# Patient Record
Sex: Female | Born: 1935 | Race: White | Hispanic: No | Smoking: Former smoker
Health system: Southern US, Community
[De-identification: ages and names within clinical notes are randomized; demographics above are authoritative.]

## PROBLEM LIST (undated history)

## (undated) DIAGNOSIS — I10 Essential (primary) hypertension: Secondary | ICD-10-CM

## (undated) DIAGNOSIS — N289 Disorder of kidney and ureter, unspecified: Secondary | ICD-10-CM

## (undated) DIAGNOSIS — D649 Anemia, unspecified: Secondary | ICD-10-CM

## (undated) HISTORY — PX: THYROID SURGERY: SHX805

---

## 1997-08-28 ENCOUNTER — Ambulatory Visit (HOSPITAL_COMMUNITY): Admission: RE | Admit: 1997-08-28 | Discharge: 1997-08-28 | Payer: Self-pay | Admitting: *Deleted

## 1998-12-24 ENCOUNTER — Other Ambulatory Visit: Admission: RE | Admit: 1998-12-24 | Discharge: 1998-12-24 | Payer: Self-pay | Admitting: Gastroenterology

## 1998-12-24 ENCOUNTER — Encounter (INDEPENDENT_AMBULATORY_CARE_PROVIDER_SITE_OTHER): Payer: Self-pay | Admitting: *Deleted

## 2000-03-20 ENCOUNTER — Other Ambulatory Visit: Admission: RE | Admit: 2000-03-20 | Discharge: 2000-03-20 | Payer: Self-pay | Admitting: Obstetrics and Gynecology

## 2001-03-15 ENCOUNTER — Other Ambulatory Visit: Admission: RE | Admit: 2001-03-15 | Discharge: 2001-03-15 | Payer: Self-pay | Admitting: Obstetrics and Gynecology

## 2002-01-25 ENCOUNTER — Encounter (INDEPENDENT_AMBULATORY_CARE_PROVIDER_SITE_OTHER): Payer: Self-pay | Admitting: *Deleted

## 2002-12-24 ENCOUNTER — Encounter: Admission: RE | Admit: 2002-12-24 | Discharge: 2002-12-24 | Payer: Self-pay | Admitting: Endocrinology

## 2003-12-24 ENCOUNTER — Ambulatory Visit: Payer: Self-pay | Admitting: Gastroenterology

## 2003-12-30 ENCOUNTER — Ambulatory Visit (HOSPITAL_COMMUNITY): Admission: RE | Admit: 2003-12-30 | Discharge: 2003-12-30 | Payer: Self-pay | Admitting: Gastroenterology

## 2004-01-16 ENCOUNTER — Ambulatory Visit: Payer: Self-pay | Admitting: Gastroenterology

## 2004-01-19 ENCOUNTER — Encounter (INDEPENDENT_AMBULATORY_CARE_PROVIDER_SITE_OTHER): Payer: Self-pay | Admitting: *Deleted

## 2004-01-19 ENCOUNTER — Ambulatory Visit: Payer: Self-pay | Admitting: Gastroenterology

## 2004-01-30 ENCOUNTER — Ambulatory Visit (HOSPITAL_COMMUNITY): Admission: RE | Admit: 2004-01-30 | Discharge: 2004-01-30 | Payer: Self-pay | Admitting: Gastroenterology

## 2004-05-14 ENCOUNTER — Ambulatory Visit: Payer: Self-pay | Admitting: Gastroenterology

## 2004-12-20 ENCOUNTER — Ambulatory Visit: Payer: Self-pay | Admitting: Gastroenterology

## 2004-12-23 ENCOUNTER — Encounter (INDEPENDENT_AMBULATORY_CARE_PROVIDER_SITE_OTHER): Payer: Self-pay | Admitting: Specialist

## 2004-12-23 ENCOUNTER — Encounter (INDEPENDENT_AMBULATORY_CARE_PROVIDER_SITE_OTHER): Payer: Self-pay | Admitting: *Deleted

## 2004-12-23 ENCOUNTER — Ambulatory Visit: Payer: Self-pay | Admitting: Gastroenterology

## 2005-05-05 ENCOUNTER — Encounter: Admission: RE | Admit: 2005-05-05 | Discharge: 2005-05-05 | Payer: Self-pay | Admitting: Endocrinology

## 2006-01-27 ENCOUNTER — Encounter: Admission: RE | Admit: 2006-01-27 | Discharge: 2006-01-27 | Payer: Self-pay | Admitting: Endocrinology

## 2006-03-17 ENCOUNTER — Encounter: Admission: RE | Admit: 2006-03-17 | Discharge: 2006-03-17 | Payer: Self-pay | Admitting: Obstetrics and Gynecology

## 2007-05-25 ENCOUNTER — Ambulatory Visit (HOSPITAL_COMMUNITY): Admission: RE | Admit: 2007-05-25 | Discharge: 2007-05-25 | Payer: Self-pay | Admitting: Obstetrics and Gynecology

## 2007-08-31 ENCOUNTER — Encounter: Admission: RE | Admit: 2007-08-31 | Discharge: 2007-08-31 | Payer: Self-pay | Admitting: Endocrinology

## 2007-08-31 ENCOUNTER — Encounter: Payer: Self-pay | Admitting: Gastroenterology

## 2008-11-07 ENCOUNTER — Encounter (INDEPENDENT_AMBULATORY_CARE_PROVIDER_SITE_OTHER): Payer: Self-pay | Admitting: *Deleted

## 2008-11-21 ENCOUNTER — Ambulatory Visit: Payer: Self-pay | Admitting: Gastroenterology

## 2008-11-21 ENCOUNTER — Encounter (INDEPENDENT_AMBULATORY_CARE_PROVIDER_SITE_OTHER): Payer: Self-pay | Admitting: *Deleted

## 2009-01-09 ENCOUNTER — Ambulatory Visit: Payer: Self-pay | Admitting: Gastroenterology

## 2009-01-09 DIAGNOSIS — Z860100 Personal history of colon polyps, unspecified: Secondary | ICD-10-CM | POA: Insufficient documentation

## 2009-01-09 DIAGNOSIS — I10 Essential (primary) hypertension: Secondary | ICD-10-CM

## 2009-01-12 ENCOUNTER — Ambulatory Visit: Payer: Self-pay | Admitting: Gastroenterology

## 2009-09-01 ENCOUNTER — Encounter: Admission: RE | Admit: 2009-09-01 | Discharge: 2009-09-01 | Payer: Self-pay | Admitting: Endocrinology

## 2010-01-23 ENCOUNTER — Encounter: Payer: Self-pay | Admitting: Gastroenterology

## 2010-01-24 ENCOUNTER — Encounter: Payer: Self-pay | Admitting: Obstetrics and Gynecology

## 2010-02-04 NOTE — Procedures (Signed)
Summary: EGD Lyla Son)   EGD  Procedure date:  01/19/2004  Findings:      Location: [facility]   Patient Name: Sheila Herring, Sheila Herring MRN:  Procedure Procedures: Panendoscopy (EGD) CPT: [id number].  Personnel: Endoscopist: Clarene Reamer, MD.  Exam Location: Exam performed in Outpatient Clinic. Outpatient  Patient Consent: Procedure, Alternatives, Risks and Benefits discussed, consent obtained, from patient. Consent was obtained by the RN.  Indications Symptoms: Dysphagia.  History  Current Medications: Patient is not currently taking Coumadin.  Pre-Exam Physical: Performed Jan 25, 2002  Entire physical exam was normal. Cardio- pulmonary exam, HEENT exam, Abdominal exam, Extremity exam, Mental status exam WNL.  Exam Exam Info: Maximum depth of insertion Duodenum, intended Duodenum. Vocal cords visualized. Gastric retroflexion performed. Images were not taken. ASA Classification: II. Tolerance: good.  Sedation Meds: Patient assessed and found to be appropriate for moderate (conscious) sedation. Fentanyl 50 mcg. given IV. Versed 4 mg. given IV. Cetacaine Spray 2 sprays given aerosolized.  Monitoring: BP and pulse monitoring done. Oximetry used. Supplemental O2 given  Findings - HIATAL HERNIA: 1 cms. in length. decreased les with decreased cricopharyngeus.  - Dilation: Fundus. Maloney dilator used, Diameter: 54 mm, No Resistance, No Heme present on extraction. Patient tolerance good.  - Normal: Fundus to Jejunum. Comments: no significat motility noted.   Assessment Abnormal examination, see findings above.  Events  Unplanned Intervention: No unplanned interventions were required.  Unplanned Events: There were no complications. Plans Medication(s): Continue current medications. Promotility: Metaclopramide 5mg  ac ac,   Patient Education: Patient given standard instructions for: Hiatal Hernia. believe primarily a motility disorder.    Disposition: After procedure patient sent to recovery. After recovery patient sent home.   This report was created from the original endoscopy report, which was reviewed and signed by the above listed endoscopist.    cc. Aldean Jewett

## 2010-02-04 NOTE — Procedures (Signed)
Summary: Colonoscopy  Lyla Son)   Colonoscopy  Procedure date:  12/23/2004  Findings:      Results: Polyp.  Location:  [facility].    Colonoscopy  Procedure date:  12/23/2004  Findings:      Results: Polyp.  Location:  [facility].   Patient Name: Sheila Herring, Sheila Herring MRN:  Procedure Procedures: Colonoscopy CPT: [phone].  Personnel: Endoscopist: Clarene Reamer, MD.  Exam Location: Exam performed in Outpatient Clinic. Outpatient  Patient Consent: Procedure, Alternatives, Risks and Benefits discussed, consent obtained, from patient. Consent was obtained by the RN.  Indications  Evaluation of: Polyps seen on prior Colonoscopy.  Surveillance of: Adenomatous Polyp(s).  History  Current Medications: Patient is not currently taking Coumadin.  Pre-Exam Physical: Performed Jan 25, 2002. Entire physical exam was normal. Cardio- pulmonary exam, Rectal exam, HEENT exam , Abdominal exam, Extremity exam, Mental status exam WNL.  Comments: Pt. history reviewed/updated, physical exam performed prior to initiation of sedation? Exam Exam: Extent of exam reached: Cecum, extent intended: Cecum.  The cecum was identified by appendiceal orifice and IC valve. Colon retroflexion performed. Images taken. ASA Classification: II. Tolerance: good.  Monitoring: Pulse and BP monitoring, Oximetry used. Supplemental O2 given.  Colon Prep Prep results: good.  Sedation Meds: Patient assessed and found to be appropriate for moderate (conscious) sedation. Fentanyl given IV. Versed given IV.  Findings POLYP: Sigmoid Colon, Maximum size: 2 mm. sessile polyp. Procedure:  hot biopsy, removed, not retrieved,  POLYP: Sigmoid Colon, Maximum size: 6 mm. sessile polyp. Procedure:  hot biopsy, removed, retrieved, Polyp sent to pathology. ICD9: Colon Polyps: 211.3.  POLYP: Sigmoid Colon, Maximum size: 2 mm. sessile polyp. Procedure:  hot biopsy, removed, not  retrieved,  POLYP: Sigmoid Colon, Maximum size: 2 mm. sessile polyp. Procedure:  hot biopsy, removed, not retrieved,  POLYP: Rectum, Maximum size: 2 mm. sessile polyp. Procedure:  hot biopsy, removed, not retrieved,   Assessment Abnormal examination, see findings above.  Diagnoses: 211.3: Colon Polyps.   Events  Unplanned Interventions: No intervention was required.  Unplanned Events: There were no complications. Plans Disposition: After procedure patient sent to recovery. After recovery patient sent home.  This report was created from the original endoscopy report, which was reviewed and signed by the above listed endoscopist.    cc. Aldean Jewett

## 2010-02-04 NOTE — Procedures (Signed)
Summary: Colonoscopy  Patient: [name] Note: All result statuses are Final unless otherwise noted.  Tests: (1) Colonoscopy (COL)   COL Colonoscopy           [facility]     [place], [place]  [address]           COLONOSCOPY PROCEDURE REPORT           PATIENT:  Sheila Herring, Sheila Herring  MR#:  [id number]     BIRTHDATE:  [birth year], 75 yrs. old  GENDER:  female           ENDOSCOPIST:  Sandy Salaam. Deatra Ina, MD     Referred by:           PROCEDURE DATE:  01/12/2009     PROCEDURE:  Surveillance Colonoscopy     ASA CLASS:  Class II     INDICATIONS:  history of pre-cancerous (adenomatous) colon polyps                 MEDICATIONS:   Fentanyl 75 mcg IV, Versed 9 mg IV           DESCRIPTION OF PROCEDURE:   After the risks benefits and     alternatives of the procedure were thoroughly explained, informed     consent was obtained.  Digital rectal exam was performed and     revealed no abnormalities.   The LB [id number] endoscope     was introduced through the anus and advanced to the cecum, which     was identified by the ileocecal valve, Moderate amount of retained     solid and liquid stool  The quality of the prep was adequate.  The     instrument was then slowly withdrawn as the colon was fully     examined.     <<PROCEDUREIMAGES>>           FINDINGS:  Mild diverticulosis was found in the sigmoid colon (see     image13).  This was otherwise a normal examination of the colon     (see image2, image4, image5, image7, image9, image10, image12,     image14, and image15).   Retroflexed views in the rectum revealed     no abnormalities.    The scope was then withdrawn from the patient     and the procedure completed.           COMPLICATIONS:  None           ENDOSCOPIC IMPRESSION:     1) Mild diverticulosis in the sigmoid colon     2) Otherwise normal examination     RECOMMENDATIONS:     1) colonoscopy           REPEAT EXAM:  In 7 year(s) for  Colonoscopy.           ______________________________     Sandy Salaam. Deatra Ina, MD           CC:  Margaretmary Dys, MD           n.     Lorrin Mais:   Sandy Salaam. Mykela Mewborn at 01/12/2009 03:19 PM           Deetta Perla, [id number]  Note: An exclamation mark (!) indicates a result that was not dispersed into the flowsheet. Document Creation Date: 01/12/2009 3:19 PM _______________________________________________________________________  (1) Order result status: Final Collection or observation date-time: 01/12/2009 15:14 Requested date-time:  Receipt date-time:  Reported date-time:  Referring Physician:   Ordering Physician: Erskine Emery [phone]) Specimen Source:  Source: Tawanna Cooler Order Number: [id number] Lab site:   Appended Document: Colonoscopy    Clinical Lists Changes  Observations: Added new observation of COLONNXTDUE: 01/2016 (01/12/2009 15:40)

## 2010-02-04 NOTE — Procedures (Signed)
Summary: Colonoscopy Lyla Son)   Colonoscopy  Procedure date:  01/25/2002  Findings:      Results: Polyp.  Location:  [facility].    Comments:      Repeat colonoscopy in 2 years.    Colonoscopy  Procedure date:  01/25/2002  Findings:      Results: Polyp.  Location:  [facility].    Comments:      Repeat colonoscopy in 2 years.   Patient Name: Sheila Herring, Trimarchi MRN:  Procedure Procedures: Colonoscopy CPT: [phone].    with Hot Biopsy(s)CPT: [id number].    with polypectomy. CPT: [id number].  Personnel: Endoscopist: Clarene Reamer, MD.  Exam Location: Exam performed in Outpatient Clinic. Outpatient  Patient Consent: Procedure, Alternatives, Risks and Benefits discussed, consent obtained, from patient. Consent was obtained by the RN.  Indications  Surveillance of: Adenomatous Polyp(s).  History  Pre-Exam Physical: Performed Jan 25, 2002. Cardio-pulmonary exam, Rectal exam, HEENT exam , Abdominal exam, Extremity exam, Mental status exam WNL.  Exam Exam: Extent of exam reached: Cecum, extent intended: Cecum.  The cecum was identified by appendiceal orifice and IC valve. Colon retroflexion performed. Images were not taken. ASA Classification: II. Tolerance: good.  Monitoring: Pulse and BP monitoring, Oximetry used. Supplemental O2 given.  Colon Prep Prep results: good.  Sedation Meds: Patient assessed and found to be appropriate for moderate (conscious) sedation. Fentanyl 100 mcg. given IV. Versed 5 mg. given IV.  Findings - MULTIPLE POLYPS: Splenic Flexure to Sigmoid Colon. minimum size 2 mm, maximum size 4 mm. Procedure:  hot biopsy, removed, Polyp not retrieved, ICD9: Colon Polyps: 211.3.   Assessment Abnormal examination, see findings above.  Diagnoses: 211.3: Colon Polyps.   Events  Unplanned Interventions: No intervention was required.  Unplanned Events: There were no complications. Plans Medication Plan: Continue  current medications.  Patient Education: Patient given standard instructions for: Polyps. Yearly hemoccult testing recommended. Patient instructed to get routine colonoscopy every 2 years.  Disposition: After procedure patient sent to recovery. After recovery patient sent home.  This report was created from the original endoscopy report, which was reviewed and signed by the above listed endoscopist.

## 2010-02-04 NOTE — Letter (Signed)
Summary: Colonoscopy/with Mag citrate/fleet enema  [facility]  [place], [place] [address]   Phone: [phone]  Fax: [phone]       [name]    01-Mar-1933    MRN: [id number]        Procedure Day /Date:MONDAY 01/12/2009     Arrival Time:1:30PM     Procedure Time:2:30PM     Location of Procedure:                    X  [place] (4th Floor)   PREPARATION FOR COLONOSCOPY WITH MAGNESIUM CITRATE/FLEET ENEMA  3 days before your procedure, purchase three  8 oz. bottle of Magnesium Citrate and two Fleet Enema from the laxative section of your drugstore.  _________________________________________________________________________________________________  3 DAYS  BEFORE YOUR PROCEDURE             DATE: 01/10/2009 DAY: SATURDAY  1.   You will be on a clear liquid diet  2.   Do not drink anything colored red or purple.  Avoid juices with pulp.  No orange juice.              CLEAR LIQUIDS INCLUDE: Water Jello Ice Popsicles Tea (sugar ok, no milk/cream) Powdered fruit flavored drinks Coffee (sugar ok, no milk/cream) Gatorade Juice: apple, white grape, white cranberry  Lemonade Clear bullion, consomm, broth Carbonated beverages (any kind) Strained chicken noodle soup Hard Candy   3. You will drink one bottle of Mag citrate in the morning and 1 bottle in the afternoon  Sunday 1/9 (2) DAYS before- Stay on clear liquids, you will drink one bottle of Mag Citraite in the morning and use 1 fleet enema in the afternoon ___________________________________________________________________________________________________  THE DAY OF YOUR PROCEDURE     Day-3       DATE: 01/12/2009 DAY: monday  1.  Use Fleet Enema one hour prior to coming for procedure.  2.   You may drink clear liquids until 12:30pm (2 hours before exam)       MEDICATION INSTRUCTIONS  Unless otherwise instructed, you should take regular prescription medications  with a small sip of water as early as possible the morning of your procedure.           OTHER INSTRUCTIONS  You will need a responsible adult at least 75 years of age to accompany you and drive you home.   This person must remain in the waiting room during your procedure.  Wear loose fitting clothing that is easily removed.  Leave jewelry and other valuables at home.  However, you may wish to bring a book to read or an iPod/MP3 player to listen to music as you wait for your procedure to start.  Remove all body piercing jewelry and leave at home.  Total time from sign-in until discharge is approximately 2-3 hours.  You should go home directly after your procedure and rest.  You can resume normal activities the day after your procedure.  The day of your procedure you should not:   Drive   Make legal decisions   Operate machinery   Drink alcohol   Return to work  You will receive specific instructions about eating, activities and medications before you leave.   The above instructions have been reviewed and explained to me by   _______________________    I fully understand and can verbalize these instructions _____________________________ Date _________

## 2010-02-04 NOTE — Assessment & Plan Note (Signed)
Summary: DISCUSS REC COL/SCRNING...AS.   History of Present Illness Visit Type: new patient  Primary GI MD: Erskine Emery MD [facility] Primary Provider: Margaretmary Dys, MD  Requesting Provider: n/a Chief Complaint: dysphagia History of Present Illness:   Sheila Herring is a 77 year old white female referred for followup of colon polyps.  A sessile villous adenoma of the rectum was removed transanally in 1997.  She has had followup colonoscopies with removal of polyps.  Last colonoscopy in 2006 demonstrated hyperplastic polyps only.  She has no GI complaints including abdominal pain, change in bowel habits, melena or hematochezia.   GI Review of Systems    Reports dysphagia with liquids.      Denies abdominal pain, acid reflux, belching, bloating, chest pain, dysphagia with solids, heartburn, loss of appetite, nausea, vomiting, vomiting blood, weight loss, and  weight gain.        Denies anal fissure, black tarry stools, change in bowel habit, constipation, diarrhea, diverticulosis, fecal incontinence, heme positive stool, hemorrhoids, irritable bowel syndrome, jaundice, light color stool, liver problems, rectal bleeding, and  rectal pain.                                                         [id number]   Current Medications (verified): 1)  Atenolol 25 Mg Tabs (Atenolol) .Marland Kitchen.. 1 By Mouth Once Daily 2)  Lipitor 10 Mg Tabs (Atorvastatin Calcium) .Marland Kitchen.. 1 By Mouth Once Daily 3)  Hydrochlorothiazide 25 Mg Tabs (Hydrochlorothiazide) .... 1/2 By Mouth Once Daily 4)  Synthroid 125 Mcg Tabs (Levothyroxine Sodium) .... 1/2 Tablet By Mouth Once Daily 5)  Cyanocobalamin 1000 Mcg/ml Soln (Cyanocobalamin) .Marland Kitchen.. 1 Injection Monthly 6)  Zantac 75 75 Mg Tabs (Ranitidine Hcl) .... One Tablet By Mouth Once Daily  Allergies (verified): 1)  !  Sulfa  Past History:  Past Medical History: hyperplastic polyp 2006 rectal polyp 1997 tubular adenomatous polyps 1997 GERD Hypertension Hypothyroidism Hyperlipidemia Anxiety Disorder Anal Fissure Anemia  Past Surgical History: Thyroid Surgery  Family History: No FH of Colon Cancer: Family History of Diabetes: mother and brother  Family History of Heart Disease: maternal side heart problems   Social History: Occupation: Retired Widowed 4 Childern Patient is a former smoker.  Alcohol Use - yes Daily Caffeine Use: one daily  Illicit Drug Use - no Smoking Status:  quit Drug Use:  no  Review of Systems       The patient complains of fever, itching, and sleeping problems.  The patient denies allergy/sinus, anemia, anxiety-new, arthritis/joint pain, back pain, blood in urine, breast changes/lumps, change in vision, confusion, cough, coughing up blood, depression-new, fainting, fatigue, headaches-new, hearing problems, heart murmur, heart rhythm changes, menstrual pain, muscle pains/cramps, night sweats, nosebleeds, pregnancy symptoms, shortness of breath, skin rash, sore throat, swelling of feet/legs, swollen lymph glands, thirst - excessive , urination - excessive , urination changes/pain, urine leakage, vision changes, and voice change.         All other systems were reviewed and were negative   Vital Signs:  Patient profile:   77 year old female Height:      64 inches Weight:      138 pounds BMI:     23.77 BSA:     1.67 Pulse rate:   60 / minute Pulse rhythm:   regular BP sitting:   110 /  64  (left arm) Cuff size:   regular  Vitals Entered By: Hope Pigeon CMA (January 09, 2009 10:32 AM)  Physical Exam  Additional Exam:  She is a well-developed well-nourished female  skin: anicteric HEENT: normocephalic; PEERLA; no nasal or pharyngeal abnormalities neck: supple nodes: no cervical lymphadenopathy chest: clear to ausculatation and percussion heart: no  murmurs, gallops, or rubs abd: soft, nontender; BS normoactive; no abdominal masses, tenderness, organomegaly rectal: deferred ext: no cynanosis, clubbing, edema skeletal: no deformities neuro: oriented x 3; no focal abnormalities    Impression & Recommendations:  Problem # 1:  PERSONAL HISTORY OF COLONIC POLYPS (ICD-V12.72)  Plan followup colonoscopy  Orders: Colonoscopy (Colon)  Problem # 2:  ESSENTIAL HYPERTENSION, BENIGN (ICD-401.1) Assessment: Comment Only  Patient Instructions: 1)  cc Dr. Margaretmary Dys 2)  Your Colonoscopy is scheduled for 01/12/2009 at 2:30pm 3)  The medication list was reviewed and reconciled.  All changed / newly prescribed medications were explained.  A complete medication list was provided to the patient / caregiver.

## 2011-01-07 DIAGNOSIS — I10 Essential (primary) hypertension: Secondary | ICD-10-CM | POA: Diagnosis not present

## 2011-01-07 DIAGNOSIS — E039 Hypothyroidism, unspecified: Secondary | ICD-10-CM | POA: Diagnosis not present

## 2011-01-07 DIAGNOSIS — G479 Sleep disorder, unspecified: Secondary | ICD-10-CM | POA: Diagnosis not present

## 2011-04-08 DIAGNOSIS — E785 Hyperlipidemia, unspecified: Secondary | ICD-10-CM | POA: Diagnosis not present

## 2011-04-08 DIAGNOSIS — I10 Essential (primary) hypertension: Secondary | ICD-10-CM | POA: Diagnosis not present

## 2011-04-08 DIAGNOSIS — E039 Hypothyroidism, unspecified: Secondary | ICD-10-CM | POA: Diagnosis not present

## 2011-04-08 DIAGNOSIS — G479 Sleep disorder, unspecified: Secondary | ICD-10-CM | POA: Diagnosis not present

## 2011-08-12 ENCOUNTER — Other Ambulatory Visit: Payer: Self-pay | Admitting: Family Medicine

## 2011-08-12 DIAGNOSIS — D510 Vitamin B12 deficiency anemia due to intrinsic factor deficiency: Secondary | ICD-10-CM | POA: Diagnosis not present

## 2011-08-12 DIAGNOSIS — I714 Abdominal aortic aneurysm, without rupture: Secondary | ICD-10-CM

## 2011-08-12 DIAGNOSIS — I10 Essential (primary) hypertension: Secondary | ICD-10-CM | POA: Diagnosis not present

## 2011-08-12 DIAGNOSIS — E039 Hypothyroidism, unspecified: Secondary | ICD-10-CM | POA: Diagnosis not present

## 2011-08-19 ENCOUNTER — Ambulatory Visit
Admission: RE | Admit: 2011-08-19 | Discharge: 2011-08-19 | Disposition: A | Discharge status: Home / Self Care | Payer: Medicare Other | Source: Ambulatory Visit | Attending: Family Medicine | Admitting: Family Medicine

## 2011-08-19 DIAGNOSIS — I10 Essential (primary) hypertension: Secondary | ICD-10-CM | POA: Diagnosis not present

## 2011-08-19 DIAGNOSIS — I714 Abdominal aortic aneurysm, without rupture: Secondary | ICD-10-CM

## 2011-10-08 DIAGNOSIS — Z23 Encounter for immunization: Secondary | ICD-10-CM | POA: Diagnosis not present

## 2012-01-24 DIAGNOSIS — Z01419 Encounter for gynecological examination (general) (routine) without abnormal findings: Secondary | ICD-10-CM | POA: Diagnosis not present

## 2012-01-24 DIAGNOSIS — Z1231 Encounter for screening mammogram for malignant neoplasm of breast: Secondary | ICD-10-CM | POA: Diagnosis not present

## 2012-02-24 DIAGNOSIS — N184 Chronic kidney disease, stage 4 (severe): Secondary | ICD-10-CM | POA: Diagnosis not present

## 2012-02-24 DIAGNOSIS — E039 Hypothyroidism, unspecified: Secondary | ICD-10-CM | POA: Diagnosis not present

## 2012-02-24 DIAGNOSIS — E785 Hyperlipidemia, unspecified: Secondary | ICD-10-CM | POA: Diagnosis not present

## 2012-02-24 DIAGNOSIS — D510 Vitamin B12 deficiency anemia due to intrinsic factor deficiency: Secondary | ICD-10-CM | POA: Diagnosis not present

## 2012-04-12 DIAGNOSIS — D4959 Neoplasm of unspecified behavior of other genitourinary organ: Secondary | ICD-10-CM | POA: Diagnosis not present

## 2012-04-12 DIAGNOSIS — N760 Acute vaginitis: Secondary | ICD-10-CM | POA: Diagnosis not present

## 2012-04-12 DIAGNOSIS — A6004 Herpesviral vulvovaginitis: Secondary | ICD-10-CM | POA: Diagnosis not present

## 2012-04-17 DIAGNOSIS — A6004 Herpesviral vulvovaginitis: Secondary | ICD-10-CM | POA: Diagnosis not present

## 2012-05-11 DIAGNOSIS — D510 Vitamin B12 deficiency anemia due to intrinsic factor deficiency: Secondary | ICD-10-CM | POA: Diagnosis not present

## 2012-06-01 DIAGNOSIS — I10 Essential (primary) hypertension: Secondary | ICD-10-CM | POA: Diagnosis not present

## 2012-06-01 DIAGNOSIS — D649 Anemia, unspecified: Secondary | ICD-10-CM | POA: Diagnosis not present

## 2012-06-01 DIAGNOSIS — N184 Chronic kidney disease, stage 4 (severe): Secondary | ICD-10-CM | POA: Diagnosis not present

## 2012-06-01 DIAGNOSIS — I129 Hypertensive chronic kidney disease with stage 1 through stage 4 chronic kidney disease, or unspecified chronic kidney disease: Secondary | ICD-10-CM | POA: Diagnosis not present

## 2012-06-01 DIAGNOSIS — N2581 Secondary hyperparathyroidism of renal origin: Secondary | ICD-10-CM | POA: Diagnosis not present

## 2012-06-08 DIAGNOSIS — D510 Vitamin B12 deficiency anemia due to intrinsic factor deficiency: Secondary | ICD-10-CM | POA: Diagnosis not present

## 2012-06-21 DIAGNOSIS — N184 Chronic kidney disease, stage 4 (severe): Secondary | ICD-10-CM | POA: Diagnosis not present

## 2012-06-21 DIAGNOSIS — I120 Hypertensive chronic kidney disease with stage 5 chronic kidney disease or end stage renal disease: Secondary | ICD-10-CM | POA: Diagnosis not present

## 2012-07-09 DIAGNOSIS — E785 Hyperlipidemia, unspecified: Secondary | ICD-10-CM | POA: Diagnosis not present

## 2012-07-09 DIAGNOSIS — E213 Hyperparathyroidism, unspecified: Secondary | ICD-10-CM | POA: Diagnosis not present

## 2012-07-09 DIAGNOSIS — I129 Hypertensive chronic kidney disease with stage 1 through stage 4 chronic kidney disease, or unspecified chronic kidney disease: Secondary | ICD-10-CM | POA: Diagnosis not present

## 2012-07-09 DIAGNOSIS — E039 Hypothyroidism, unspecified: Secondary | ICD-10-CM | POA: Diagnosis not present

## 2012-07-09 DIAGNOSIS — I10 Essential (primary) hypertension: Secondary | ICD-10-CM | POA: Diagnosis not present

## 2012-07-09 DIAGNOSIS — N2581 Secondary hyperparathyroidism of renal origin: Secondary | ICD-10-CM | POA: Diagnosis not present

## 2012-09-07 DIAGNOSIS — I10 Essential (primary) hypertension: Secondary | ICD-10-CM | POA: Diagnosis not present

## 2012-09-07 DIAGNOSIS — D510 Vitamin B12 deficiency anemia due to intrinsic factor deficiency: Secondary | ICD-10-CM | POA: Diagnosis not present

## 2012-09-07 DIAGNOSIS — E039 Hypothyroidism, unspecified: Secondary | ICD-10-CM | POA: Diagnosis not present

## 2012-09-07 DIAGNOSIS — E785 Hyperlipidemia, unspecified: Secondary | ICD-10-CM | POA: Diagnosis not present

## 2012-10-13 DIAGNOSIS — Z23 Encounter for immunization: Secondary | ICD-10-CM | POA: Diagnosis not present

## 2012-10-25 DIAGNOSIS — N189 Chronic kidney disease, unspecified: Secondary | ICD-10-CM | POA: Diagnosis not present

## 2012-10-25 DIAGNOSIS — D649 Anemia, unspecified: Secondary | ICD-10-CM | POA: Diagnosis not present

## 2012-10-25 DIAGNOSIS — N2581 Secondary hyperparathyroidism of renal origin: Secondary | ICD-10-CM | POA: Diagnosis not present

## 2012-10-25 DIAGNOSIS — I10 Essential (primary) hypertension: Secondary | ICD-10-CM | POA: Diagnosis not present

## 2012-11-05 DIAGNOSIS — I120 Hypertensive chronic kidney disease with stage 5 chronic kidney disease or end stage renal disease: Secondary | ICD-10-CM | POA: Diagnosis not present

## 2012-11-19 DIAGNOSIS — D510 Vitamin B12 deficiency anemia due to intrinsic factor deficiency: Secondary | ICD-10-CM | POA: Diagnosis not present

## 2012-11-19 DIAGNOSIS — M79609 Pain in unspecified limb: Secondary | ICD-10-CM | POA: Diagnosis not present

## 2013-04-15 DIAGNOSIS — N184 Chronic kidney disease, stage 4 (severe): Secondary | ICD-10-CM | POA: Diagnosis not present

## 2013-04-15 DIAGNOSIS — E039 Hypothyroidism, unspecified: Secondary | ICD-10-CM | POA: Diagnosis not present

## 2013-04-15 DIAGNOSIS — I10 Essential (primary) hypertension: Secondary | ICD-10-CM | POA: Diagnosis not present

## 2013-04-15 DIAGNOSIS — F411 Generalized anxiety disorder: Secondary | ICD-10-CM | POA: Diagnosis not present

## 2013-04-15 DIAGNOSIS — I714 Abdominal aortic aneurysm, without rupture, unspecified: Secondary | ICD-10-CM | POA: Diagnosis not present

## 2013-05-28 DIAGNOSIS — D649 Anemia, unspecified: Secondary | ICD-10-CM | POA: Diagnosis not present

## 2013-05-28 DIAGNOSIS — N189 Chronic kidney disease, unspecified: Secondary | ICD-10-CM | POA: Diagnosis not present

## 2013-05-28 DIAGNOSIS — N183 Chronic kidney disease, stage 3 unspecified: Secondary | ICD-10-CM | POA: Diagnosis not present

## 2013-05-28 DIAGNOSIS — I10 Essential (primary) hypertension: Secondary | ICD-10-CM | POA: Diagnosis not present

## 2013-05-28 DIAGNOSIS — N2581 Secondary hyperparathyroidism of renal origin: Secondary | ICD-10-CM | POA: Diagnosis not present

## 2013-08-16 DIAGNOSIS — I714 Abdominal aortic aneurysm, without rupture, unspecified: Secondary | ICD-10-CM | POA: Diagnosis not present

## 2013-08-16 DIAGNOSIS — I10 Essential (primary) hypertension: Secondary | ICD-10-CM | POA: Diagnosis not present

## 2013-08-16 DIAGNOSIS — D510 Vitamin B12 deficiency anemia due to intrinsic factor deficiency: Secondary | ICD-10-CM | POA: Diagnosis not present

## 2013-08-16 DIAGNOSIS — F411 Generalized anxiety disorder: Secondary | ICD-10-CM | POA: Diagnosis not present

## 2013-08-16 DIAGNOSIS — E785 Hyperlipidemia, unspecified: Secondary | ICD-10-CM | POA: Diagnosis not present

## 2013-08-16 DIAGNOSIS — E039 Hypothyroidism, unspecified: Secondary | ICD-10-CM | POA: Diagnosis not present

## 2013-08-16 DIAGNOSIS — N184 Chronic kidney disease, stage 4 (severe): Secondary | ICD-10-CM | POA: Diagnosis not present

## 2013-08-19 ENCOUNTER — Other Ambulatory Visit: Payer: Self-pay | Admitting: Family Medicine

## 2013-08-19 DIAGNOSIS — I714 Abdominal aortic aneurysm, without rupture, unspecified: Secondary | ICD-10-CM

## 2013-08-30 ENCOUNTER — Ambulatory Visit
Admission: RE | Admit: 2013-08-30 | Discharge: 2013-08-30 | Disposition: A | Discharge status: Home / Self Care | Payer: Medicare Other | Source: Ambulatory Visit | Attending: Family Medicine | Admitting: Family Medicine

## 2013-08-30 DIAGNOSIS — I77819 Aortic ectasia, unspecified site: Secondary | ICD-10-CM | POA: Diagnosis not present

## 2013-08-30 DIAGNOSIS — I714 Abdominal aortic aneurysm, without rupture, unspecified: Secondary | ICD-10-CM

## 2013-11-18 DIAGNOSIS — N2581 Secondary hyperparathyroidism of renal origin: Secondary | ICD-10-CM | POA: Diagnosis not present

## 2013-11-18 DIAGNOSIS — N184 Chronic kidney disease, stage 4 (severe): Secondary | ICD-10-CM | POA: Diagnosis not present

## 2013-11-18 DIAGNOSIS — D631 Anemia in chronic kidney disease: Secondary | ICD-10-CM | POA: Diagnosis not present

## 2013-11-18 DIAGNOSIS — N189 Chronic kidney disease, unspecified: Secondary | ICD-10-CM | POA: Diagnosis not present

## 2013-11-18 DIAGNOSIS — I129 Hypertensive chronic kidney disease with stage 1 through stage 4 chronic kidney disease, or unspecified chronic kidney disease: Secondary | ICD-10-CM | POA: Diagnosis not present

## 2014-02-21 DIAGNOSIS — F419 Anxiety disorder, unspecified: Secondary | ICD-10-CM | POA: Diagnosis not present

## 2014-02-21 DIAGNOSIS — D696 Thrombocytopenia, unspecified: Secondary | ICD-10-CM | POA: Diagnosis not present

## 2014-02-21 DIAGNOSIS — Z0000 Encounter for general adult medical examination without abnormal findings: Secondary | ICD-10-CM | POA: Diagnosis not present

## 2014-02-21 DIAGNOSIS — N184 Chronic kidney disease, stage 4 (severe): Secondary | ICD-10-CM | POA: Diagnosis not present

## 2014-02-21 DIAGNOSIS — D510 Vitamin B12 deficiency anemia due to intrinsic factor deficiency: Secondary | ICD-10-CM | POA: Diagnosis not present

## 2014-02-21 DIAGNOSIS — E785 Hyperlipidemia, unspecified: Secondary | ICD-10-CM | POA: Diagnosis not present

## 2014-02-21 DIAGNOSIS — I10 Essential (primary) hypertension: Secondary | ICD-10-CM | POA: Diagnosis not present

## 2014-02-21 DIAGNOSIS — E039 Hypothyroidism, unspecified: Secondary | ICD-10-CM | POA: Diagnosis not present

## 2014-05-02 DIAGNOSIS — N2581 Secondary hyperparathyroidism of renal origin: Secondary | ICD-10-CM | POA: Diagnosis not present

## 2014-05-02 DIAGNOSIS — N184 Chronic kidney disease, stage 4 (severe): Secondary | ICD-10-CM | POA: Diagnosis not present

## 2014-05-02 DIAGNOSIS — I129 Hypertensive chronic kidney disease with stage 1 through stage 4 chronic kidney disease, or unspecified chronic kidney disease: Secondary | ICD-10-CM | POA: Diagnosis not present

## 2014-05-02 DIAGNOSIS — D631 Anemia in chronic kidney disease: Secondary | ICD-10-CM | POA: Diagnosis not present

## 2014-06-06 DIAGNOSIS — N184 Chronic kidney disease, stage 4 (severe): Secondary | ICD-10-CM | POA: Diagnosis not present

## 2014-08-29 DIAGNOSIS — E785 Hyperlipidemia, unspecified: Secondary | ICD-10-CM | POA: Diagnosis not present

## 2014-08-29 DIAGNOSIS — N184 Chronic kidney disease, stage 4 (severe): Secondary | ICD-10-CM | POA: Diagnosis not present

## 2014-08-29 DIAGNOSIS — I714 Abdominal aortic aneurysm, without rupture: Secondary | ICD-10-CM | POA: Diagnosis not present

## 2014-08-29 DIAGNOSIS — E039 Hypothyroidism, unspecified: Secondary | ICD-10-CM | POA: Diagnosis not present

## 2014-08-29 DIAGNOSIS — R140 Abdominal distension (gaseous): Secondary | ICD-10-CM | POA: Diagnosis not present

## 2014-08-29 DIAGNOSIS — D510 Vitamin B12 deficiency anemia due to intrinsic factor deficiency: Secondary | ICD-10-CM | POA: Diagnosis not present

## 2014-08-29 DIAGNOSIS — D696 Thrombocytopenia, unspecified: Secondary | ICD-10-CM | POA: Diagnosis not present

## 2014-08-29 DIAGNOSIS — I10 Essential (primary) hypertension: Secondary | ICD-10-CM | POA: Diagnosis not present

## 2014-08-29 DIAGNOSIS — F419 Anxiety disorder, unspecified: Secondary | ICD-10-CM | POA: Diagnosis not present

## 2014-09-02 ENCOUNTER — Other Ambulatory Visit: Payer: Self-pay | Admitting: Family Medicine

## 2014-09-02 DIAGNOSIS — R140 Abdominal distension (gaseous): Secondary | ICD-10-CM

## 2014-09-19 DIAGNOSIS — N2581 Secondary hyperparathyroidism of renal origin: Secondary | ICD-10-CM | POA: Diagnosis not present

## 2014-09-19 DIAGNOSIS — I129 Hypertensive chronic kidney disease with stage 1 through stage 4 chronic kidney disease, or unspecified chronic kidney disease: Secondary | ICD-10-CM | POA: Diagnosis not present

## 2014-09-19 DIAGNOSIS — N184 Chronic kidney disease, stage 4 (severe): Secondary | ICD-10-CM | POA: Diagnosis not present

## 2014-09-19 DIAGNOSIS — D631 Anemia in chronic kidney disease: Secondary | ICD-10-CM | POA: Diagnosis not present

## 2014-09-26 ENCOUNTER — Ambulatory Visit
Admission: RE | Admit: 2014-09-26 | Discharge: 2014-09-26 | Disposition: A | Discharge status: Home / Self Care | Payer: Medicare Other | Source: Ambulatory Visit | Attending: Family Medicine | Admitting: Family Medicine

## 2014-09-26 DIAGNOSIS — R140 Abdominal distension (gaseous): Secondary | ICD-10-CM | POA: Diagnosis not present

## 2014-09-29 DIAGNOSIS — Z23 Encounter for immunization: Secondary | ICD-10-CM | POA: Diagnosis not present

## 2014-12-28 IMAGING — US US AORTA
1 series · 14 of 25 positions shown · non-contrast
Comparison: August 19, 2011

CLINICAL DATA: Abdominal aortic aneurysm

EXAM:
ULTRASOUND OF ABDOMINAL AORTA
TECHNIQUE: Ultrasound examination of the abdominal aorta was performed to
evaluate for abdominal aortic aneurysm.

[Series 1: us aorta · 0.32mm/px · 14 of 25 slices shown]
[im 1/25]
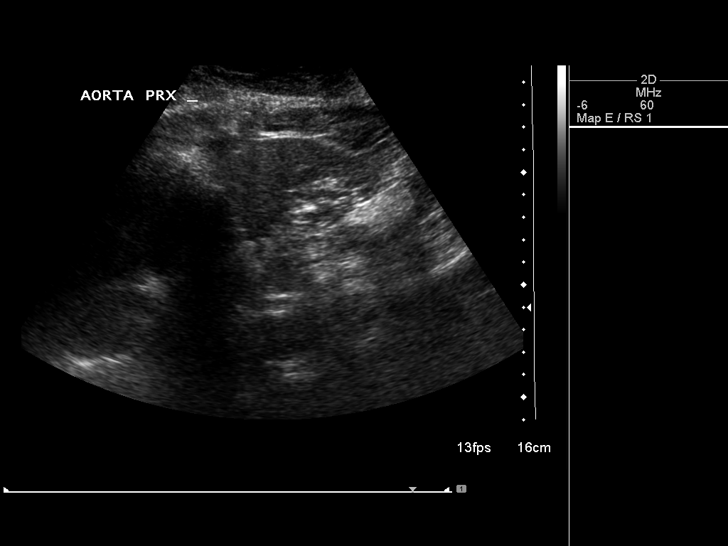
[im 3/25]
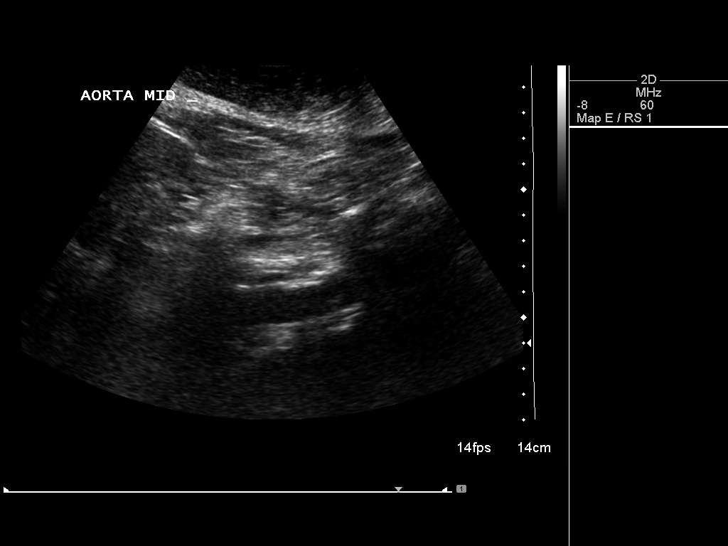
[im 5/25]
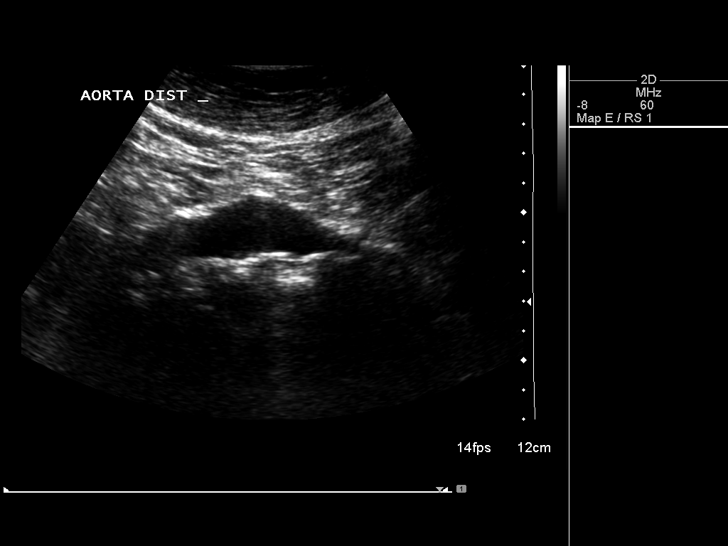
[im 7/25]
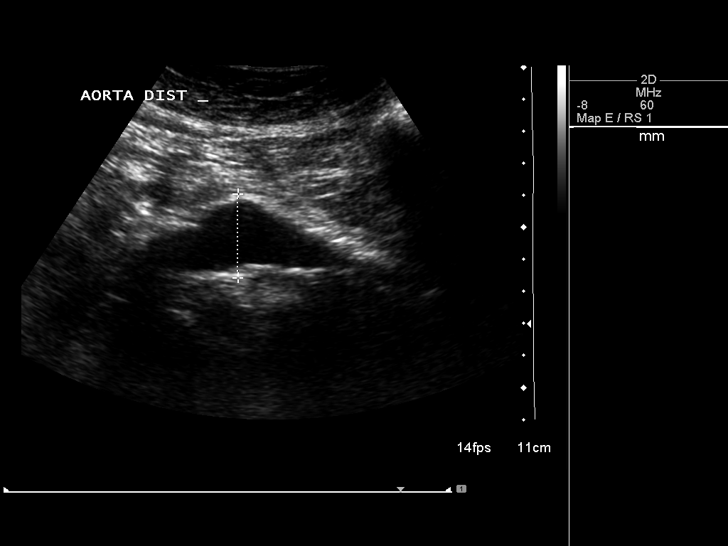
[im 9/25]
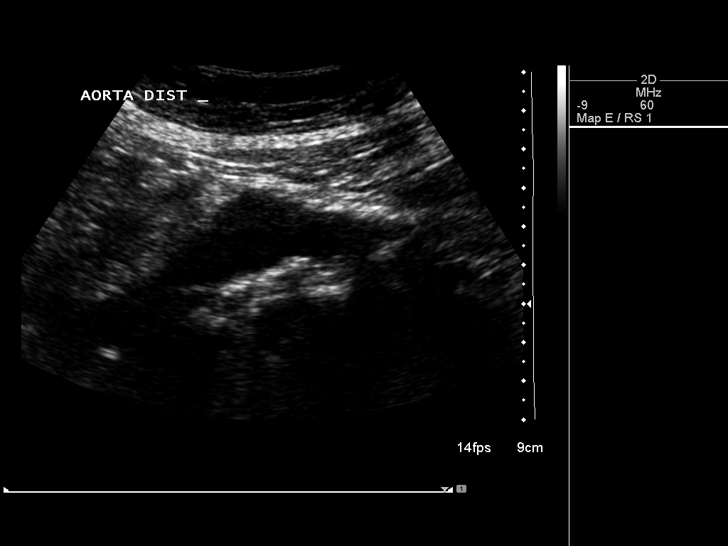
[im 10/25]
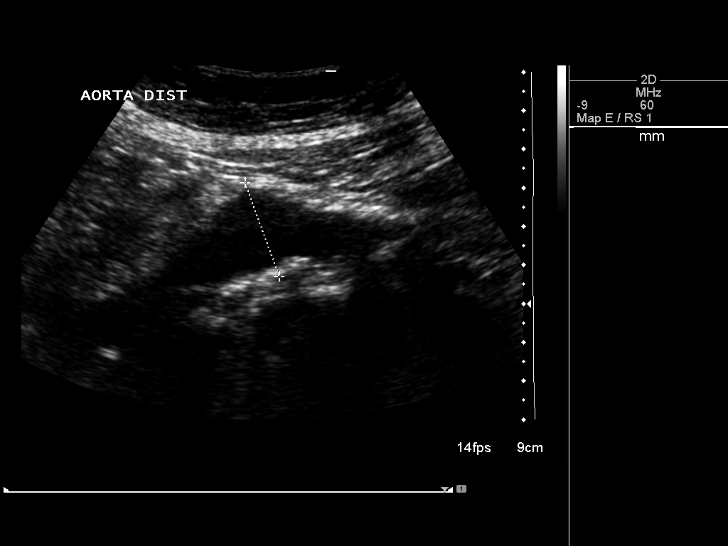
[im 12/25]
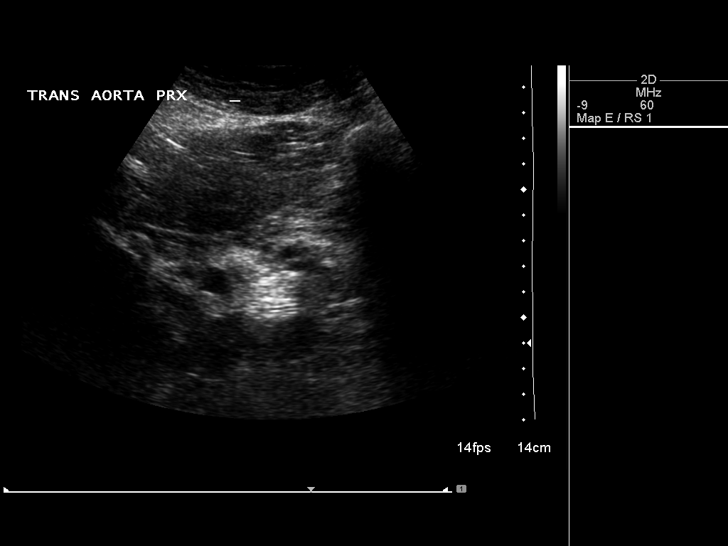
[im 14/25]
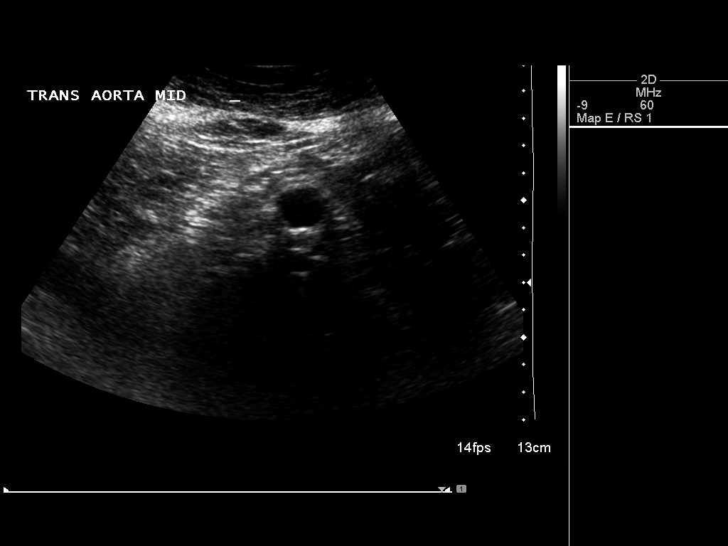
[im 16/25]
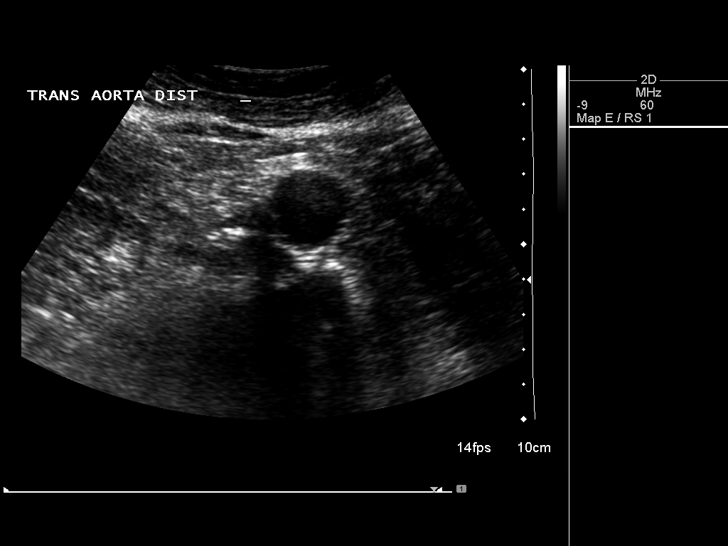
[im 17/25]
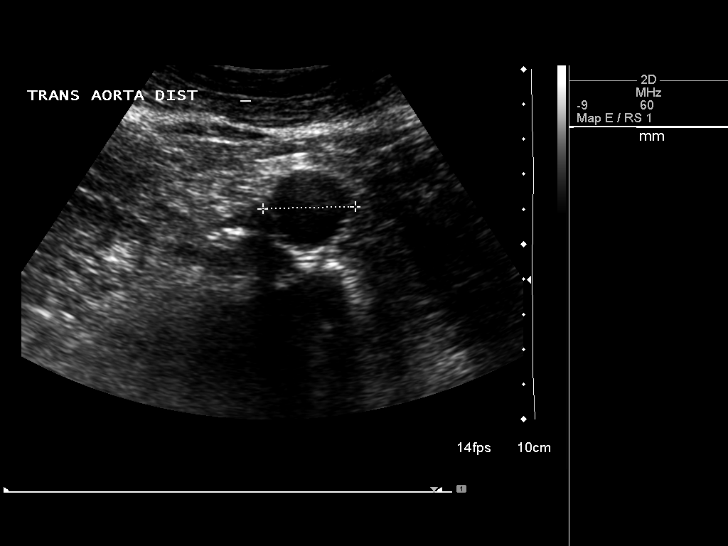
[im 19/25]
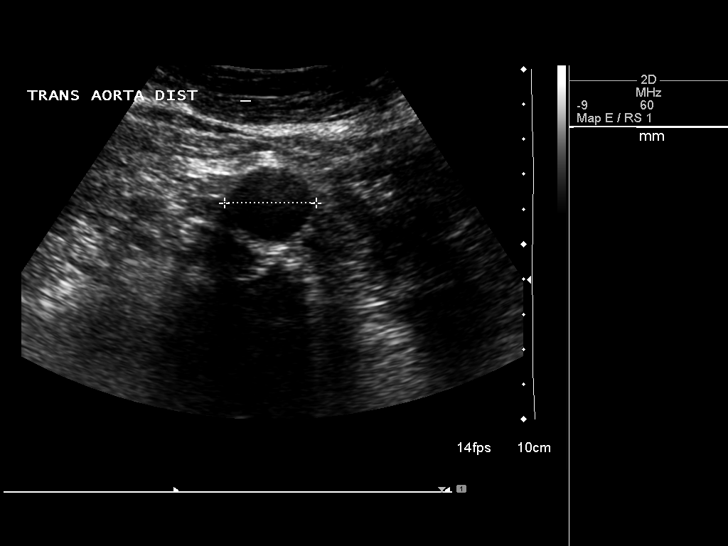
[im 21/25]
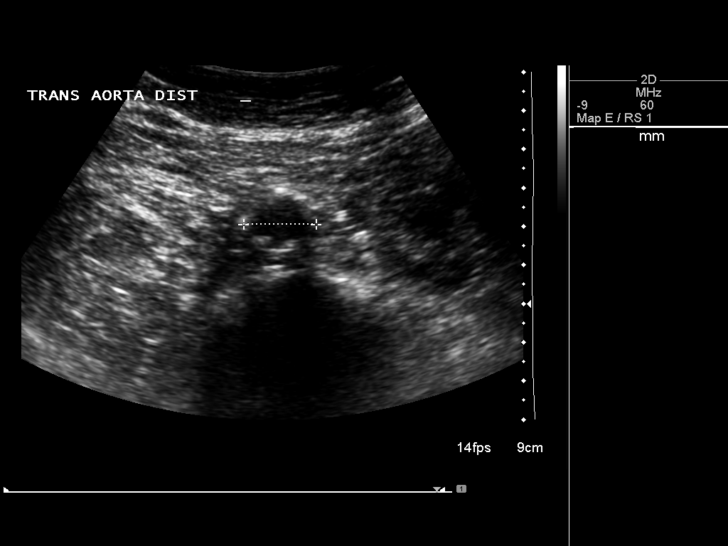
[im 23/25]
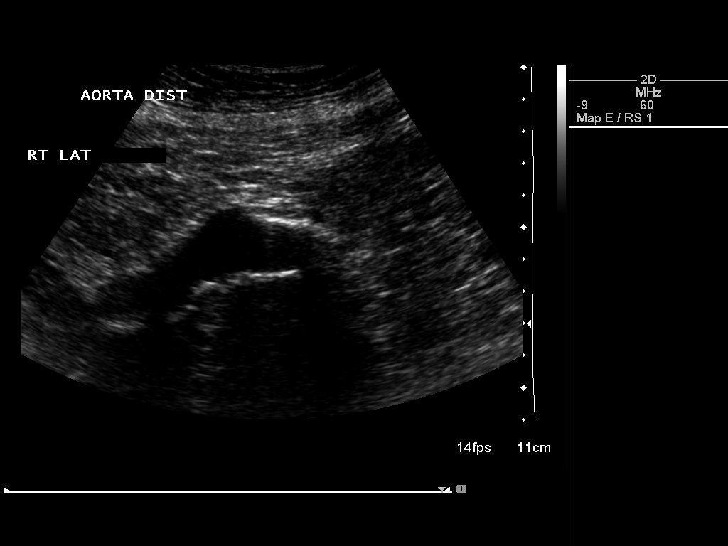
[im 25/25]
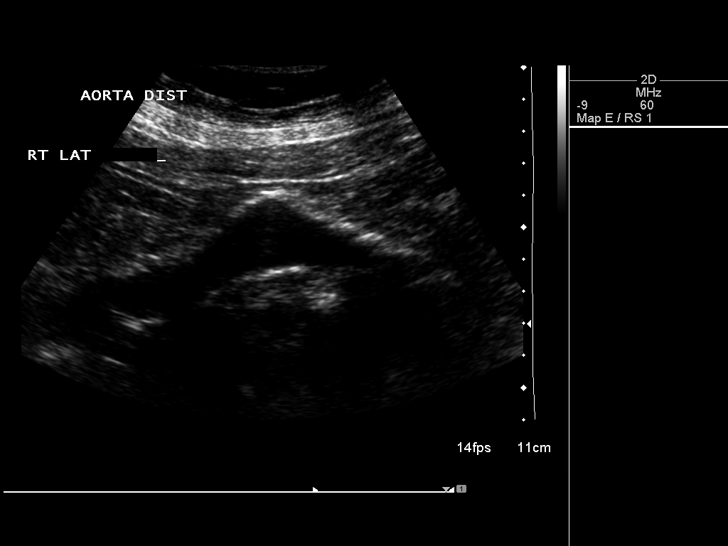

[14 of 25 positions shown; findings below may reference images not displayed]

FINDINGS: Abdominal Aorta

There is slight dilatation of the distal abdominal aorta. There is
no periaortic fluid or adenopathy. The mild distal bulging extends
over the distal 3.3 cm of the aorta.

Maximum AP

Diameter:  2.4 cm proximal; 1.8 cm mid ; 2.6 cm distal

Maximum TRV

Diameter: 2.1 cm proximal ; 2.0 cm mid ; 2.0 cm distal
IMPRESSION: Mild dilatation of the distal 3 cm of the abdominal aorta, unchanged
from 2 years prior. No periaortic fluid or adenopathy.

## 2015-02-06 DIAGNOSIS — D631 Anemia in chronic kidney disease: Secondary | ICD-10-CM | POA: Diagnosis not present

## 2015-02-06 DIAGNOSIS — N184 Chronic kidney disease, stage 4 (severe): Secondary | ICD-10-CM | POA: Diagnosis not present

## 2015-02-06 DIAGNOSIS — I129 Hypertensive chronic kidney disease with stage 1 through stage 4 chronic kidney disease, or unspecified chronic kidney disease: Secondary | ICD-10-CM | POA: Diagnosis not present

## 2015-02-06 DIAGNOSIS — Z0000 Encounter for general adult medical examination without abnormal findings: Secondary | ICD-10-CM | POA: Diagnosis not present

## 2015-02-06 DIAGNOSIS — N2581 Secondary hyperparathyroidism of renal origin: Secondary | ICD-10-CM | POA: Diagnosis not present

## 2015-02-06 DIAGNOSIS — I714 Abdominal aortic aneurysm, without rupture: Secondary | ICD-10-CM | POA: Diagnosis not present

## 2015-03-08 ENCOUNTER — Encounter (HOSPITAL_COMMUNITY): Payer: Self-pay | Admitting: Family Medicine

## 2015-03-08 ENCOUNTER — Emergency Department (HOSPITAL_COMMUNITY)
Admission: EM | Admit: 2015-03-08 | Discharge: 2015-03-08 | Disposition: A | Discharge status: Home / Self Care | Payer: Medicare Other | Attending: Emergency Medicine | Admitting: Emergency Medicine

## 2015-03-08 ENCOUNTER — Emergency Department (HOSPITAL_COMMUNITY): Payer: Medicare Other

## 2015-03-08 DIAGNOSIS — N189 Chronic kidney disease, unspecified: Secondary | ICD-10-CM | POA: Insufficient documentation

## 2015-03-08 DIAGNOSIS — Z87891 Personal history of nicotine dependence: Secondary | ICD-10-CM | POA: Diagnosis not present

## 2015-03-08 DIAGNOSIS — Z862 Personal history of diseases of the blood and blood-forming organs and certain disorders involving the immune mechanism: Secondary | ICD-10-CM | POA: Diagnosis not present

## 2015-03-08 DIAGNOSIS — J069 Acute upper respiratory infection, unspecified: Secondary | ICD-10-CM | POA: Insufficient documentation

## 2015-03-08 DIAGNOSIS — I129 Hypertensive chronic kidney disease with stage 1 through stage 4 chronic kidney disease, or unspecified chronic kidney disease: Secondary | ICD-10-CM | POA: Insufficient documentation

## 2015-03-08 DIAGNOSIS — R05 Cough: Secondary | ICD-10-CM | POA: Diagnosis not present

## 2015-03-08 HISTORY — DX: Disorder of kidney and ureter, unspecified: N28.9

## 2015-03-08 HISTORY — DX: Essential (primary) hypertension: I10

## 2015-03-08 HISTORY — DX: Anemia, unspecified: D64.9

## 2015-03-08 NOTE — Discharge Instructions (Signed)
Take tylenol 2 pills 4 times a day and motrin 4 pills 3 times a day.  Drink plenty of fluids.  Return for worsening shortness of breath, headache, confusion. Follow up with your family doctor.   Upper Respiratory Infection, Adult Most upper respiratory infections (URIs) are a viral infection of the air passages leading to the lungs. A URI affects the nose, throat, and upper air passages. The most common type of URI is nasopharyngitis and is typically referred to as "the common cold." URIs run their course and usually go away on their own. Most of the time, a URI does not require medical attention, but sometimes a bacterial infection in the upper airways can follow a viral infection. This is called a secondary infection. Sinus and middle ear infections are common types of secondary upper respiratory infections. Bacterial pneumonia can also complicate a URI. A URI can worsen asthma and chronic obstructive pulmonary disease (COPD). Sometimes, these complications can require emergency medical care and may be life threatening.  CAUSES Almost all URIs are caused by viruses. A virus is a type of germ and can spread from one person to another.  RISKS FACTORS You may be at risk for a URI if:   You smoke.   You have chronic heart or lung disease.  You have a weakened defense (immune) system.   You are very young or very old.   You have nasal allergies or asthma.  You work in crowded or poorly ventilated areas.  You work in health care facilities or schools. SIGNS AND SYMPTOMS  Symptoms typically develop 2-3 days after you come in contact with a cold virus. Most viral URIs last 7-10 days. However, viral URIs from the influenza virus (flu virus) can last 14-18 days and are typically more severe. Symptoms may include:   Runny or stuffy (congested) nose.   Sneezing.   Cough.   Sore throat.   Headache.   Fatigue.   Fever.   Loss of appetite.   Pain in your forehead, behind your  eyes, and over your cheekbones (sinus pain).  Muscle aches.  DIAGNOSIS  Your health care provider may diagnose a URI by:  Physical exam.  Tests to check that your symptoms are not due to another condition such as:  Strep throat.  Sinusitis.  Pneumonia.  Asthma. TREATMENT  A URI goes away on its own with time. It cannot be cured with medicines, but medicines may be prescribed or recommended to relieve symptoms. Medicines may help:  Reduce your fever.  Reduce your cough.  Relieve nasal congestion. HOME CARE INSTRUCTIONS   Take medicines only as directed by your health care provider.   Gargle warm saltwater or take cough drops to comfort your throat as directed by your health care provider.  Use a warm mist humidifier or inhale steam from a shower to increase air moisture. This may make it easier to breathe.  Drink enough fluid to keep your urine clear or pale yellow.   Eat soups and other clear broths and maintain good nutrition.   Rest as needed.   Return to work when your temperature has returned to normal or as your health care provider advises. You may need to stay home longer to avoid infecting others. You can also use a face mask and careful hand washing to prevent spread of the virus.  Increase the usage of your inhaler if you have asthma.   Do not use any tobacco products, including cigarettes, chewing tobacco, or electronic cigarettes.  If you need help quitting, ask your health care provider. PREVENTION  The best way to protect yourself from getting a cold is to practice good hygiene.   Avoid oral or hand contact with people with cold symptoms.   Wash your hands often if contact occurs.  There is no clear evidence that vitamin C, vitamin E, echinacea, or exercise reduces the chance of developing a cold. However, it is always recommended to get plenty of rest, exercise, and practice good nutrition.  SEEK MEDICAL CARE IF:   You are getting worse  rather than better.   Your symptoms are not controlled by medicine.   You have chills.  You have worsening shortness of breath.  You have brown or red mucus.  You have yellow or brown nasal discharge.  You have pain in your face, especially when you bend forward.  You have a fever.  You have swollen neck glands.  You have pain while swallowing.  You have white areas in the back of your throat. SEEK IMMEDIATE MEDICAL CARE IF:   You have severe or persistent:  Headache.  Ear pain.  Sinus pain.  Chest pain.  You have chronic lung disease and any of the following:  Wheezing.  Prolonged cough.  Coughing up blood.  A change in your usual mucus.  You have a stiff neck.  You have changes in your:  Vision.  Hearing.  Thinking.  Mood. MAKE SURE YOU:   Understand these instructions.  Will watch your condition.  Will get help right away if you are not doing well or get worse.   This information is not intended to replace advice given to you by your health care provider. Make sure you discuss any questions you have with your health care provider.   Document Released: 06/15/2000 Document Revised: 05/06/2014 Document Reviewed: 03/27/2013 Elsevier Interactive Patient Education [employer].

## 2015-03-08 NOTE — ED Notes (Signed)
Pt complains of intermittent productive cough, other times is a non-productive cough and sore throat. Pt reports symptoms started last Tuesday. Denies fever.

## 2015-03-08 NOTE — ED Notes (Signed)
Pt to xray

## 2015-03-08 NOTE — ED Provider Notes (Signed)
CSN: [id number]     Arrival date & time 03/08/15  2017 History   First MD Initiated Contact with Patient 03/08/15 2108     Chief Complaint  Patient presents with  . Cough  . Sore Throat     (Consider location/radiation/quality/duration/timing/severity/associated sxs/prior Treatment) Patient is a 82 y.o. female presenting with cough, pharyngitis, and general illness. The history is provided by the patient.  Cough Associated symptoms: no chest pain, no chills, no fever, no headaches, no myalgias, no rhinorrhea, no shortness of breath and no wheezing   Sore Throat Pertinent negatives include no chest pain, no headaches and no shortness of breath.  Illness Severity:  Mild Onset quality:  Gradual Duration:  2 days Timing:  Constant Progression:  Worsening Chronicity:  New Associated symptoms: congestion and cough   Associated symptoms: no chest pain, no fever, no headaches, no myalgias, no nausea, no rhinorrhea, no shortness of breath, no vomiting and no wheezing    82 yo F With a chief complaint of cough. This been going on for the past couple days with some rhinorrhea. Patient thought she got some pepper in her throat and had significant coughing fit. Her family member was concerned that she had worsening shortness of breath and encourage her to come to the hospital. Patient denies any current symptoms. Denies fevers or chills.  Past Medical History  Diagnosis Date  . Hypertension   . Anemia   . Renal disorder     Chronic Kidney Disease   Past Surgical History  Procedure Laterality Date  . Thyroid surgery     History reviewed. No pertinent family history. Social History  Substance Use Topics  . Smoking status: Former Research scientist (life sciences)  . Smokeless tobacco: Current User    Types: Snuff  . Alcohol Use: Yes     Comment: A glass of wine 2-3 times a week.    OB History    No data available     Review of Systems  Constitutional: Negative for fever and chills.  HENT: Positive for  congestion. Negative for rhinorrhea.   Eyes: Negative for redness and visual disturbance.  Respiratory: Positive for cough. Negative for shortness of breath and wheezing.   Cardiovascular: Negative for chest pain and palpitations.  Gastrointestinal: Negative for nausea and vomiting.  Genitourinary: Negative for dysuria and urgency.  Musculoskeletal: Negative for myalgias and arthralgias.  Skin: Negative for pallor and wound.  Neurological: Negative for dizziness and headaches.      Allergies  Sulfonamide derivatives  Home Medications   Prior to Admission medications   Not on File   BP 122/83 mmHg  Pulse 71  Temp(Src) 97.8 F (36.6 C) (Oral)  Resp 17  Ht 5\' 4"  (1.626 m)  Wt 133 lb (60.328 kg)  BMI 22.82 kg/m2  SpO2 96% Physical Exam  Constitutional: She is oriented to person, place, and time. She appears well-developed and well-nourished. No distress.  HENT:  Head: Normocephalic and atraumatic.  Swollen turbinates, posterior nasal drip, no noted sinus ttp, tm normal bilaterally.    Eyes: EOM are normal. Pupils are equal, round, and reactive to light.  Neck: Normal range of motion. Neck supple.  Cardiovascular: Normal rate and regular rhythm.  Exam reveals no gallop and no friction rub.   No murmur heard. Pulmonary/Chest: Effort normal. She has no wheezes. She has no rales.  Abdominal: Soft. She exhibits no distension. There is no tenderness. There is no rebound.  Musculoskeletal: She exhibits no edema or tenderness.  Neurological: She is  alert and oriented to person, place, and time.  Skin: Skin is warm and dry. She is not diaphoretic.  Psychiatric: She has a normal mood and affect. Her behavior is normal.  Nursing note and vitals reviewed.   ED Course  Procedures (including critical care time) Labs Review Labs Reviewed - No data to display  Imaging Review Dg Chest 2 View  03/08/2015  CLINICAL DATA:  Cough. Intermittently productive, intermittently nonproductive.  Sore throat. Symptoms for 6 days. EXAM: CHEST  2 VIEW COMPARISON:  None. FINDINGS: The cardiomediastinal contours are normal. Atherosclerosis noted of the aorta. Mild hyperinflation and coarsened interstitial markings. Pulmonary vasculature is normal. No consolidation, pleural effusion, or pneumothorax. No acute osseous abnormalities are seen. IMPRESSION: Mild bronchitic change, suspect this is chronic, however no prior exams available for comparison. No acute process. Electronically Signed   By: Jeb Levering M.D.   On: 03/08/2015 21:52   I have personally reviewed and evaluated these images and lab results as part of my medical decision-making.   EKG Interpretation None      MDM   Final diagnoses:  URI (upper respiratory infection)    82 yo F with a chief complaint of cough. Chest x-ray is negative for pneumonia. Patient is otherwise well-appearing. Discharge home. Symptomatically treatment.   I have discussed the diagnosis/risks/treatment options with the patient and family and believe the pt to be eligible for discharge home to follow-up with PCP. We also discussed returning to the ED immediately if new or worsening sx occur. We discussed the sx which are most concerning (e.g., sudden worsening sob, fever, inability to tolerate by mouth) that necessitate immediate return. Medications administered to the patient during their visit and any new prescriptions provided to the patient are listed below.  Medications given during this visit Medications - No data to display  There are no discharge medications for this patient.   The patient appears reasonably screen and/or stabilized for discharge and I doubt any other medical condition or other [facility] requiring further screening, evaluation, or treatment in the ED at this time prior to discharge.      Deno Etienne, DO 03/08/15 2310

## 2015-03-13 DIAGNOSIS — F419 Anxiety disorder, unspecified: Secondary | ICD-10-CM | POA: Diagnosis not present

## 2015-03-13 DIAGNOSIS — D510 Vitamin B12 deficiency anemia due to intrinsic factor deficiency: Secondary | ICD-10-CM | POA: Diagnosis not present

## 2015-03-13 DIAGNOSIS — E039 Hypothyroidism, unspecified: Secondary | ICD-10-CM | POA: Diagnosis not present

## 2015-03-13 DIAGNOSIS — N184 Chronic kidney disease, stage 4 (severe): Secondary | ICD-10-CM | POA: Diagnosis not present

## 2015-03-13 DIAGNOSIS — R634 Abnormal weight loss: Secondary | ICD-10-CM | POA: Diagnosis not present

## 2015-03-13 DIAGNOSIS — Z23 Encounter for immunization: Secondary | ICD-10-CM | POA: Diagnosis not present

## 2015-03-13 DIAGNOSIS — E785 Hyperlipidemia, unspecified: Secondary | ICD-10-CM | POA: Diagnosis not present

## 2015-03-13 DIAGNOSIS — Z0000 Encounter for general adult medical examination without abnormal findings: Secondary | ICD-10-CM | POA: Diagnosis not present

## 2015-03-13 DIAGNOSIS — I10 Essential (primary) hypertension: Secondary | ICD-10-CM | POA: Diagnosis not present

## 2015-03-13 DIAGNOSIS — D696 Thrombocytopenia, unspecified: Secondary | ICD-10-CM | POA: Diagnosis not present

## 2015-03-20 DIAGNOSIS — D501 Sideropenic dysphagia: Secondary | ICD-10-CM | POA: Diagnosis not present

## 2015-03-20 DIAGNOSIS — E039 Hypothyroidism, unspecified: Secondary | ICD-10-CM | POA: Diagnosis not present

## 2015-03-20 DIAGNOSIS — N184 Chronic kidney disease, stage 4 (severe): Secondary | ICD-10-CM | POA: Diagnosis not present

## 2015-03-20 DIAGNOSIS — Z0000 Encounter for general adult medical examination without abnormal findings: Secondary | ICD-10-CM | POA: Diagnosis not present

## 2015-03-20 DIAGNOSIS — E785 Hyperlipidemia, unspecified: Secondary | ICD-10-CM | POA: Diagnosis not present

## 2015-04-17 DIAGNOSIS — N184 Chronic kidney disease, stage 4 (severe): Secondary | ICD-10-CM | POA: Diagnosis not present

## 2015-06-19 DIAGNOSIS — E785 Hyperlipidemia, unspecified: Secondary | ICD-10-CM | POA: Diagnosis not present

## 2015-06-19 DIAGNOSIS — N184 Chronic kidney disease, stage 4 (severe): Secondary | ICD-10-CM | POA: Diagnosis not present

## 2015-06-19 DIAGNOSIS — D510 Vitamin B12 deficiency anemia due to intrinsic factor deficiency: Secondary | ICD-10-CM | POA: Diagnosis not present

## 2015-06-19 DIAGNOSIS — E039 Hypothyroidism, unspecified: Secondary | ICD-10-CM | POA: Diagnosis not present

## 2015-06-19 DIAGNOSIS — I10 Essential (primary) hypertension: Secondary | ICD-10-CM | POA: Diagnosis not present

## 2015-07-03 DIAGNOSIS — I714 Abdominal aortic aneurysm, without rupture: Secondary | ICD-10-CM | POA: Diagnosis not present

## 2015-07-03 DIAGNOSIS — N184 Chronic kidney disease, stage 4 (severe): Secondary | ICD-10-CM | POA: Diagnosis not present

## 2015-07-03 DIAGNOSIS — N2581 Secondary hyperparathyroidism of renal origin: Secondary | ICD-10-CM | POA: Diagnosis not present

## 2015-07-03 DIAGNOSIS — D631 Anemia in chronic kidney disease: Secondary | ICD-10-CM | POA: Diagnosis not present

## 2015-07-03 DIAGNOSIS — I129 Hypertensive chronic kidney disease with stage 1 through stage 4 chronic kidney disease, or unspecified chronic kidney disease: Secondary | ICD-10-CM | POA: Diagnosis not present

## 2015-10-10 DIAGNOSIS — Z23 Encounter for immunization: Secondary | ICD-10-CM | POA: Diagnosis not present

## 2015-10-23 DIAGNOSIS — I129 Hypertensive chronic kidney disease with stage 1 through stage 4 chronic kidney disease, or unspecified chronic kidney disease: Secondary | ICD-10-CM | POA: Diagnosis not present

## 2015-10-23 DIAGNOSIS — N2581 Secondary hyperparathyroidism of renal origin: Secondary | ICD-10-CM | POA: Diagnosis not present

## 2015-10-23 DIAGNOSIS — I714 Abdominal aortic aneurysm, without rupture: Secondary | ICD-10-CM | POA: Diagnosis not present

## 2015-10-23 DIAGNOSIS — N184 Chronic kidney disease, stage 4 (severe): Secondary | ICD-10-CM | POA: Diagnosis not present

## 2015-10-23 DIAGNOSIS — D631 Anemia in chronic kidney disease: Secondary | ICD-10-CM | POA: Diagnosis not present

## 2015-11-09 DIAGNOSIS — N184 Chronic kidney disease, stage 4 (severe): Secondary | ICD-10-CM | POA: Diagnosis not present

## 2015-12-25 DIAGNOSIS — F419 Anxiety disorder, unspecified: Secondary | ICD-10-CM | POA: Diagnosis not present

## 2015-12-25 DIAGNOSIS — N184 Chronic kidney disease, stage 4 (severe): Secondary | ICD-10-CM | POA: Diagnosis not present

## 2015-12-25 DIAGNOSIS — E785 Hyperlipidemia, unspecified: Secondary | ICD-10-CM | POA: Diagnosis not present

## 2015-12-25 DIAGNOSIS — D510 Vitamin B12 deficiency anemia due to intrinsic factor deficiency: Secondary | ICD-10-CM | POA: Diagnosis not present

## 2015-12-25 DIAGNOSIS — E039 Hypothyroidism, unspecified: Secondary | ICD-10-CM | POA: Diagnosis not present

## 2015-12-25 DIAGNOSIS — I10 Essential (primary) hypertension: Secondary | ICD-10-CM | POA: Diagnosis not present

## 2016-01-08 DIAGNOSIS — N2581 Secondary hyperparathyroidism of renal origin: Secondary | ICD-10-CM | POA: Diagnosis not present

## 2016-01-08 DIAGNOSIS — D631 Anemia in chronic kidney disease: Secondary | ICD-10-CM | POA: Diagnosis not present

## 2016-01-08 DIAGNOSIS — I129 Hypertensive chronic kidney disease with stage 1 through stage 4 chronic kidney disease, or unspecified chronic kidney disease: Secondary | ICD-10-CM | POA: Diagnosis not present

## 2016-01-08 DIAGNOSIS — I714 Abdominal aortic aneurysm, without rupture: Secondary | ICD-10-CM | POA: Diagnosis not present

## 2016-01-08 DIAGNOSIS — N184 Chronic kidney disease, stage 4 (severe): Secondary | ICD-10-CM | POA: Diagnosis not present

## 2016-01-24 IMAGING — US US ABDOMEN COMPLETE
1 series · 13 of 25 positions shown · non-contrast
Comparison: Ultrasound 08/30/2013.

CLINICAL DATA: Bloating.

EXAM:
ULTRASOUND ABDOMEN COMPLETE

[Series 1: us abdomen complete · 0.20mm/px · 13 of 82 slices shown]
[im 1/82]
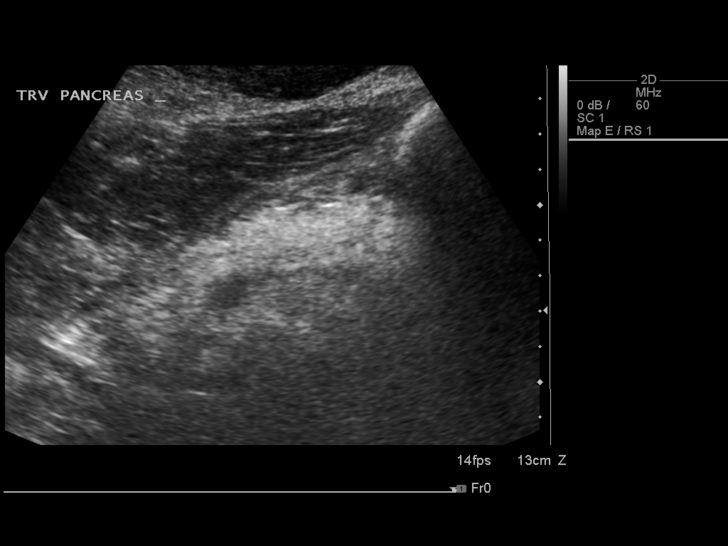
[im 7/82]
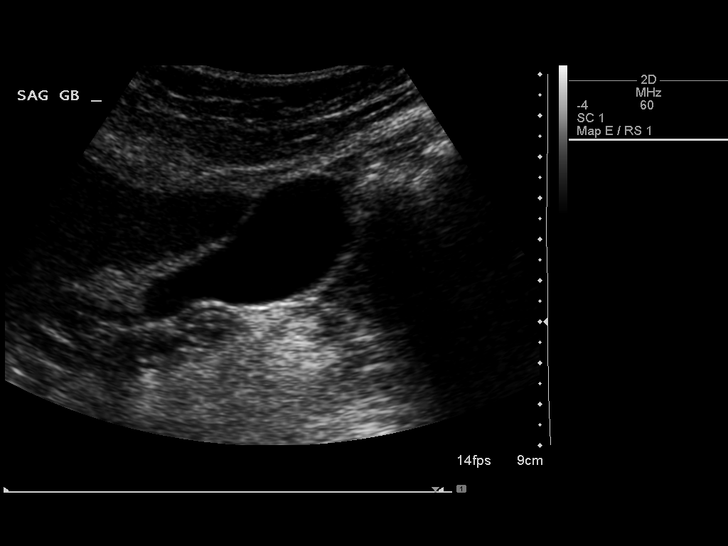
[im 14/82]
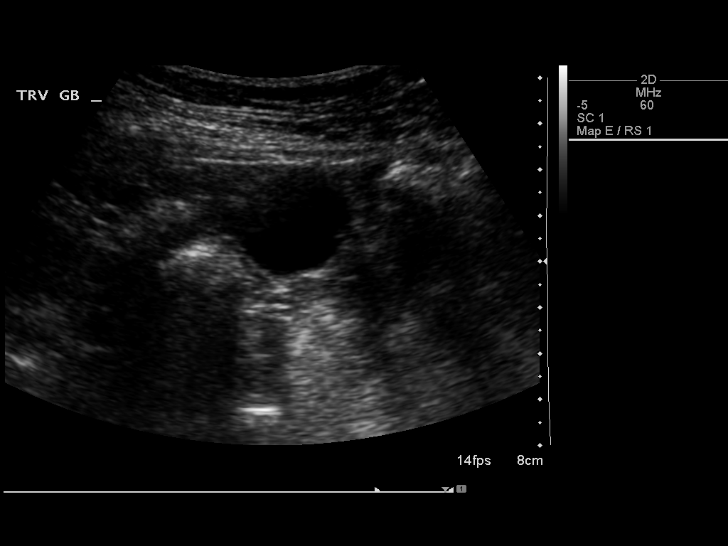
[im 21/82]
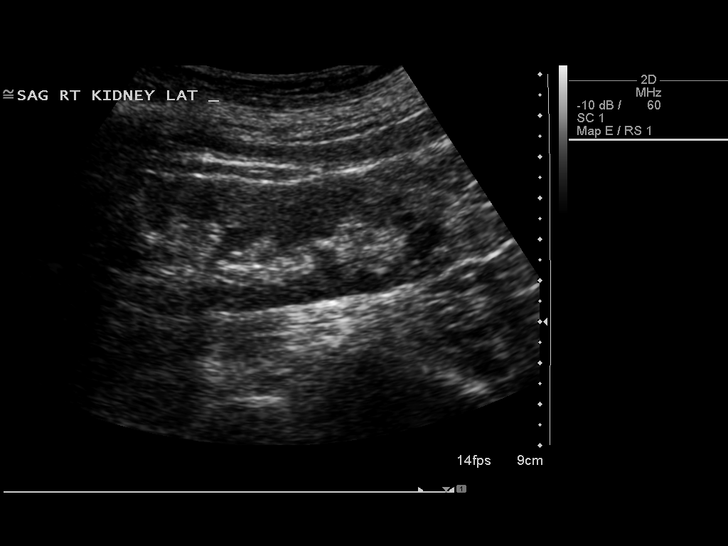
[im 28/82]
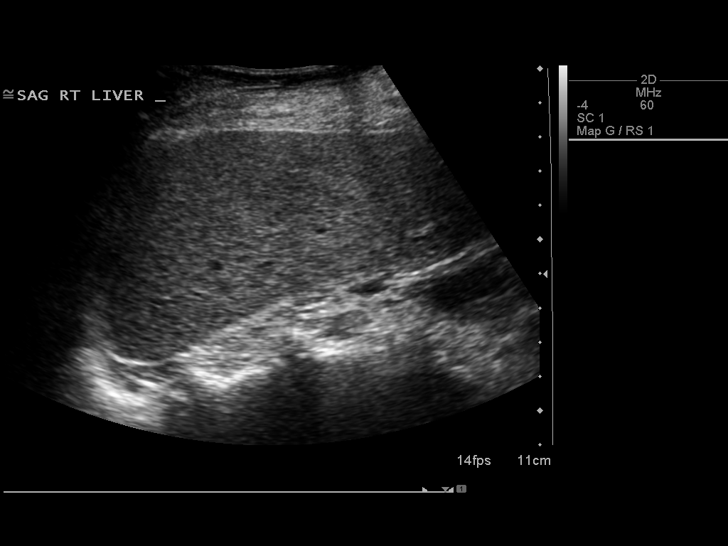
[im 34/82]
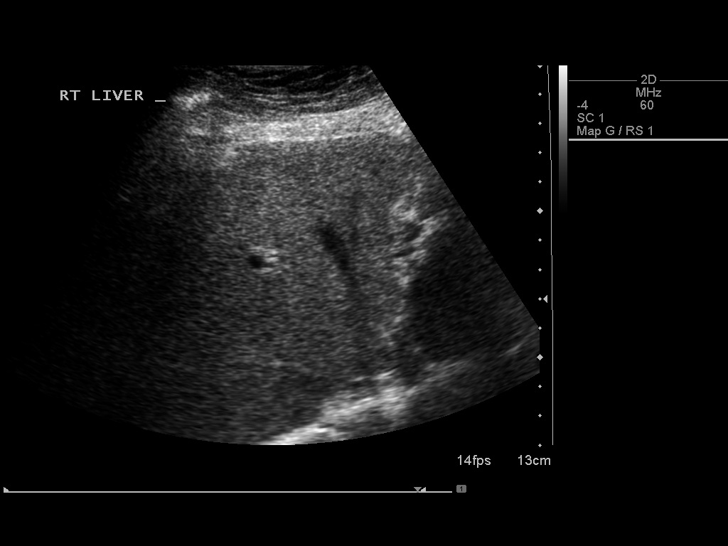
[im 41/82]
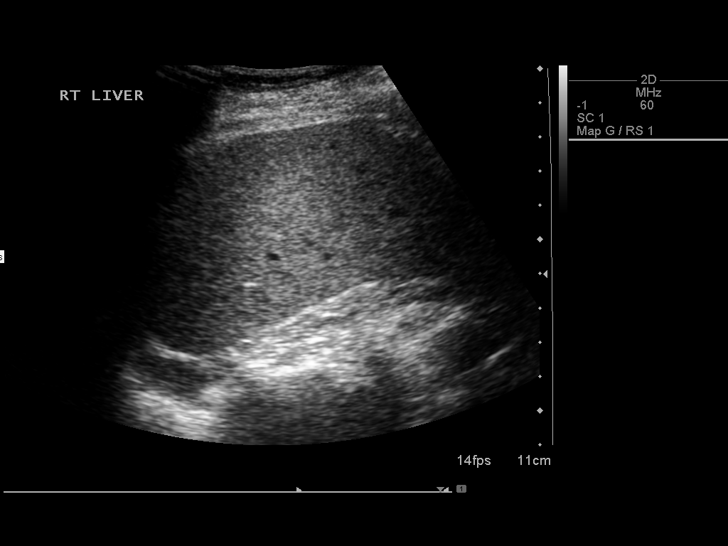
[im 48/82]
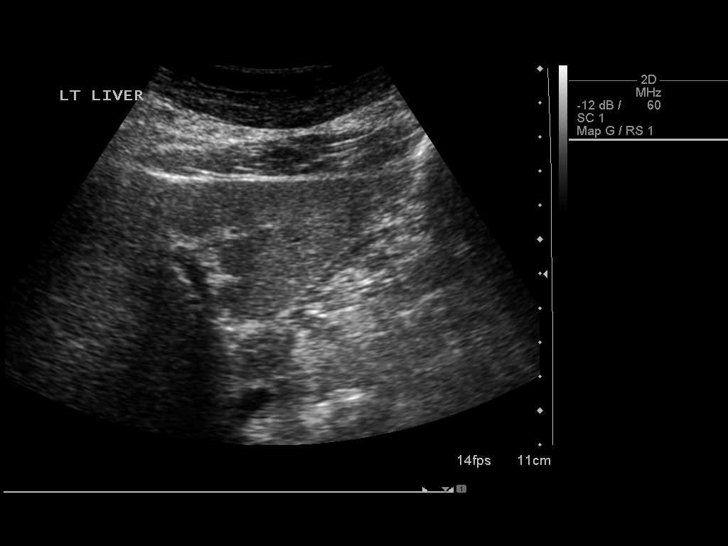
[im 55/82]
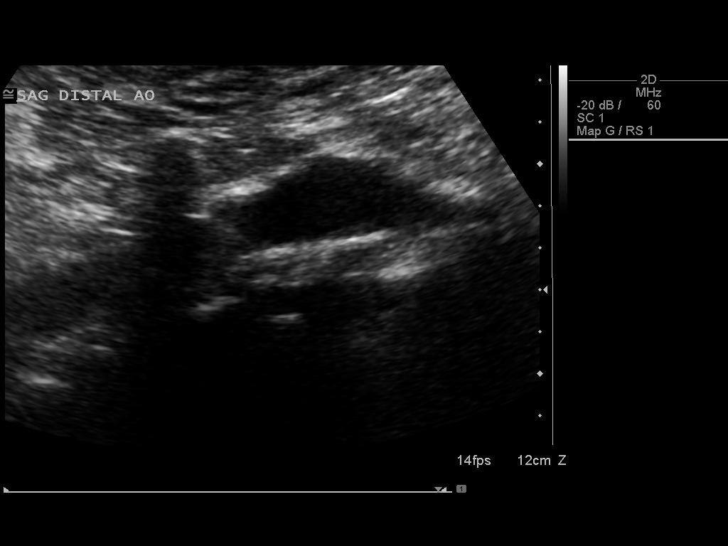
[im 61/82]
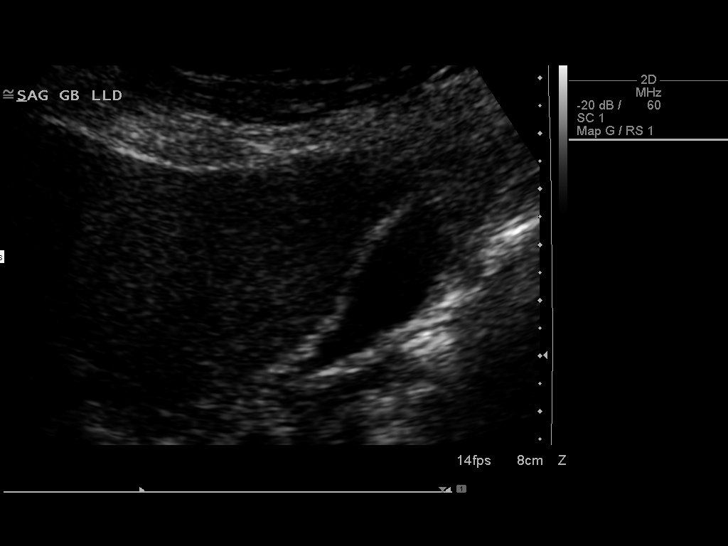
[im 68/82]
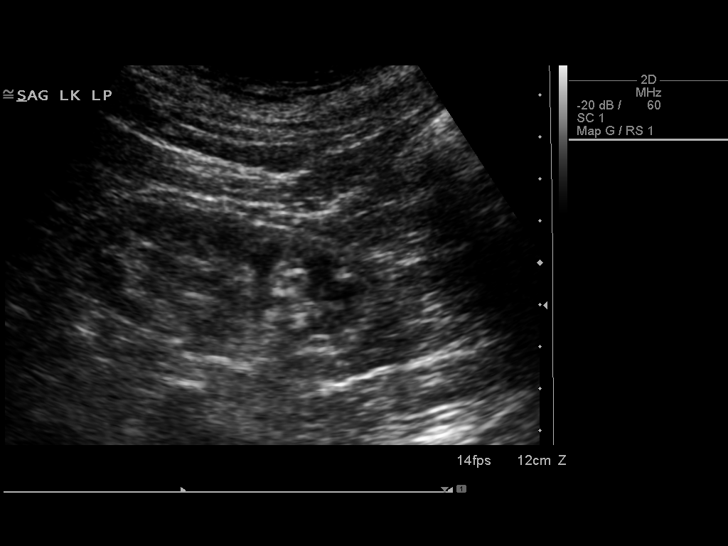
[im 75/82]
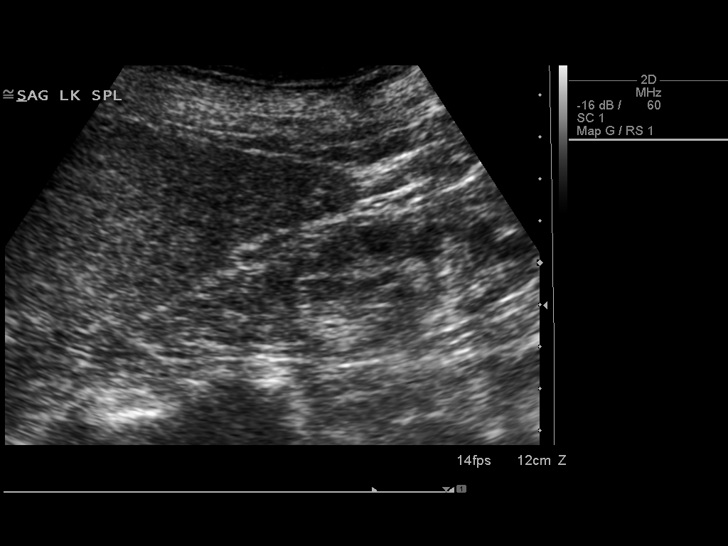
[im 82/82]
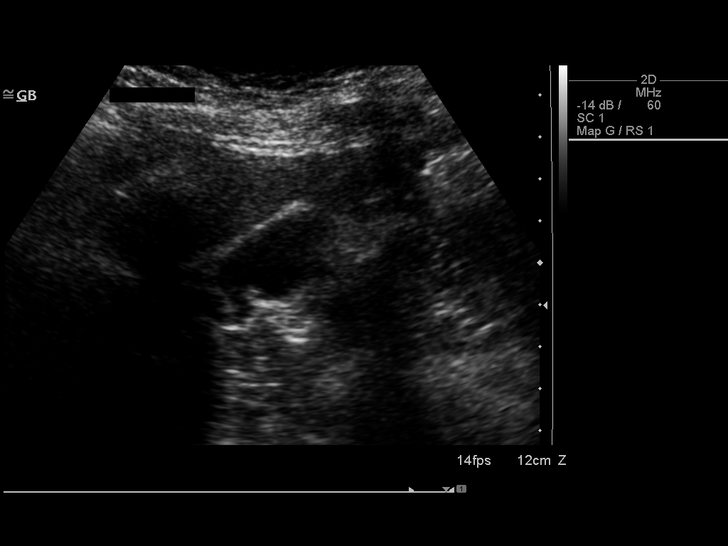

[13 of 25 positions shown; findings below may reference images not displayed]

FINDINGS: Gallbladder: No gallstones or wall thickening visualized. No
sonographic Murphy sign noted.

Common bile duct: Diameter: 4.2 mm

Liver: Liver is slightly heterogeneous, fatty infiltration cannot be
excluded. No focal hepatic abnormality.

IVC: No abnormality visualized.

Pancreas: Visualized portion unremarkable.

Spleen: Size and appearance within normal limits.

Right Kidney: Length: 9.1 cm. Echogenicity within normal limits. No
mass or hydronephrosis visualized.

Left Kidney: Length: 7 cm. Left kidney is small consistent with
atrophy. No mass lesion. No hydronephrosis.

Abdominal aorta: Mild dilatation of the abdominal aorta to 2.5 cm.

Other findings: None.
IMPRESSION: 1. Liver is heterogeneous consistent with possible fatty
infiltration. No focal hepatic abnormality identified.
2. Left renal atrophy.
3. Abdominal aortic ectasia 2.5 cm. Ectatic abdominal aorta at risk
for aneurysm development. Recommend followup by ultrasound in 5
years. This recommendation follows ACR consensus guidelines: White
Paper of the ACR Incidental Findings Committee II on Vascular
Findings. [HOSPITAL] 7353; [DATE].

## 2016-03-02 ENCOUNTER — Encounter: Payer: Self-pay | Admitting: Gastroenterology

## 2016-04-22 DIAGNOSIS — I10 Essential (primary) hypertension: Secondary | ICD-10-CM | POA: Diagnosis not present

## 2016-04-22 DIAGNOSIS — N184 Chronic kidney disease, stage 4 (severe): Secondary | ICD-10-CM | POA: Diagnosis not present

## 2016-04-22 DIAGNOSIS — I129 Hypertensive chronic kidney disease with stage 1 through stage 4 chronic kidney disease, or unspecified chronic kidney disease: Secondary | ICD-10-CM | POA: Diagnosis not present

## 2016-04-22 DIAGNOSIS — N2581 Secondary hyperparathyroidism of renal origin: Secondary | ICD-10-CM | POA: Diagnosis not present

## 2016-04-22 DIAGNOSIS — E785 Hyperlipidemia, unspecified: Secondary | ICD-10-CM | POA: Diagnosis not present

## 2016-04-22 DIAGNOSIS — D696 Thrombocytopenia, unspecified: Secondary | ICD-10-CM | POA: Diagnosis not present

## 2016-04-22 DIAGNOSIS — E039 Hypothyroidism, unspecified: Secondary | ICD-10-CM | POA: Diagnosis not present

## 2016-04-22 DIAGNOSIS — I714 Abdominal aortic aneurysm, without rupture: Secondary | ICD-10-CM | POA: Diagnosis not present

## 2016-04-22 DIAGNOSIS — N390 Urinary tract infection, site not specified: Secondary | ICD-10-CM | POA: Diagnosis not present

## 2016-04-22 DIAGNOSIS — D631 Anemia in chronic kidney disease: Secondary | ICD-10-CM | POA: Diagnosis not present

## 2016-04-22 DIAGNOSIS — Z0000 Encounter for general adult medical examination without abnormal findings: Secondary | ICD-10-CM | POA: Diagnosis not present

## 2016-06-03 DIAGNOSIS — M79672 Pain in left foot: Secondary | ICD-10-CM | POA: Diagnosis not present

## 2016-06-03 DIAGNOSIS — M79675 Pain in left toe(s): Secondary | ICD-10-CM | POA: Diagnosis not present

## 2016-07-05 IMAGING — CR DG CHEST 2V
2 series · 2 of 2 positions shown · non-contrast
Comparison: None.

CLINICAL DATA: Cough. Intermittently productive, intermittently
nonproductive. Sore throat. Symptoms for 6 days.

EXAM:
CHEST  2 VIEW

[w chest pa]
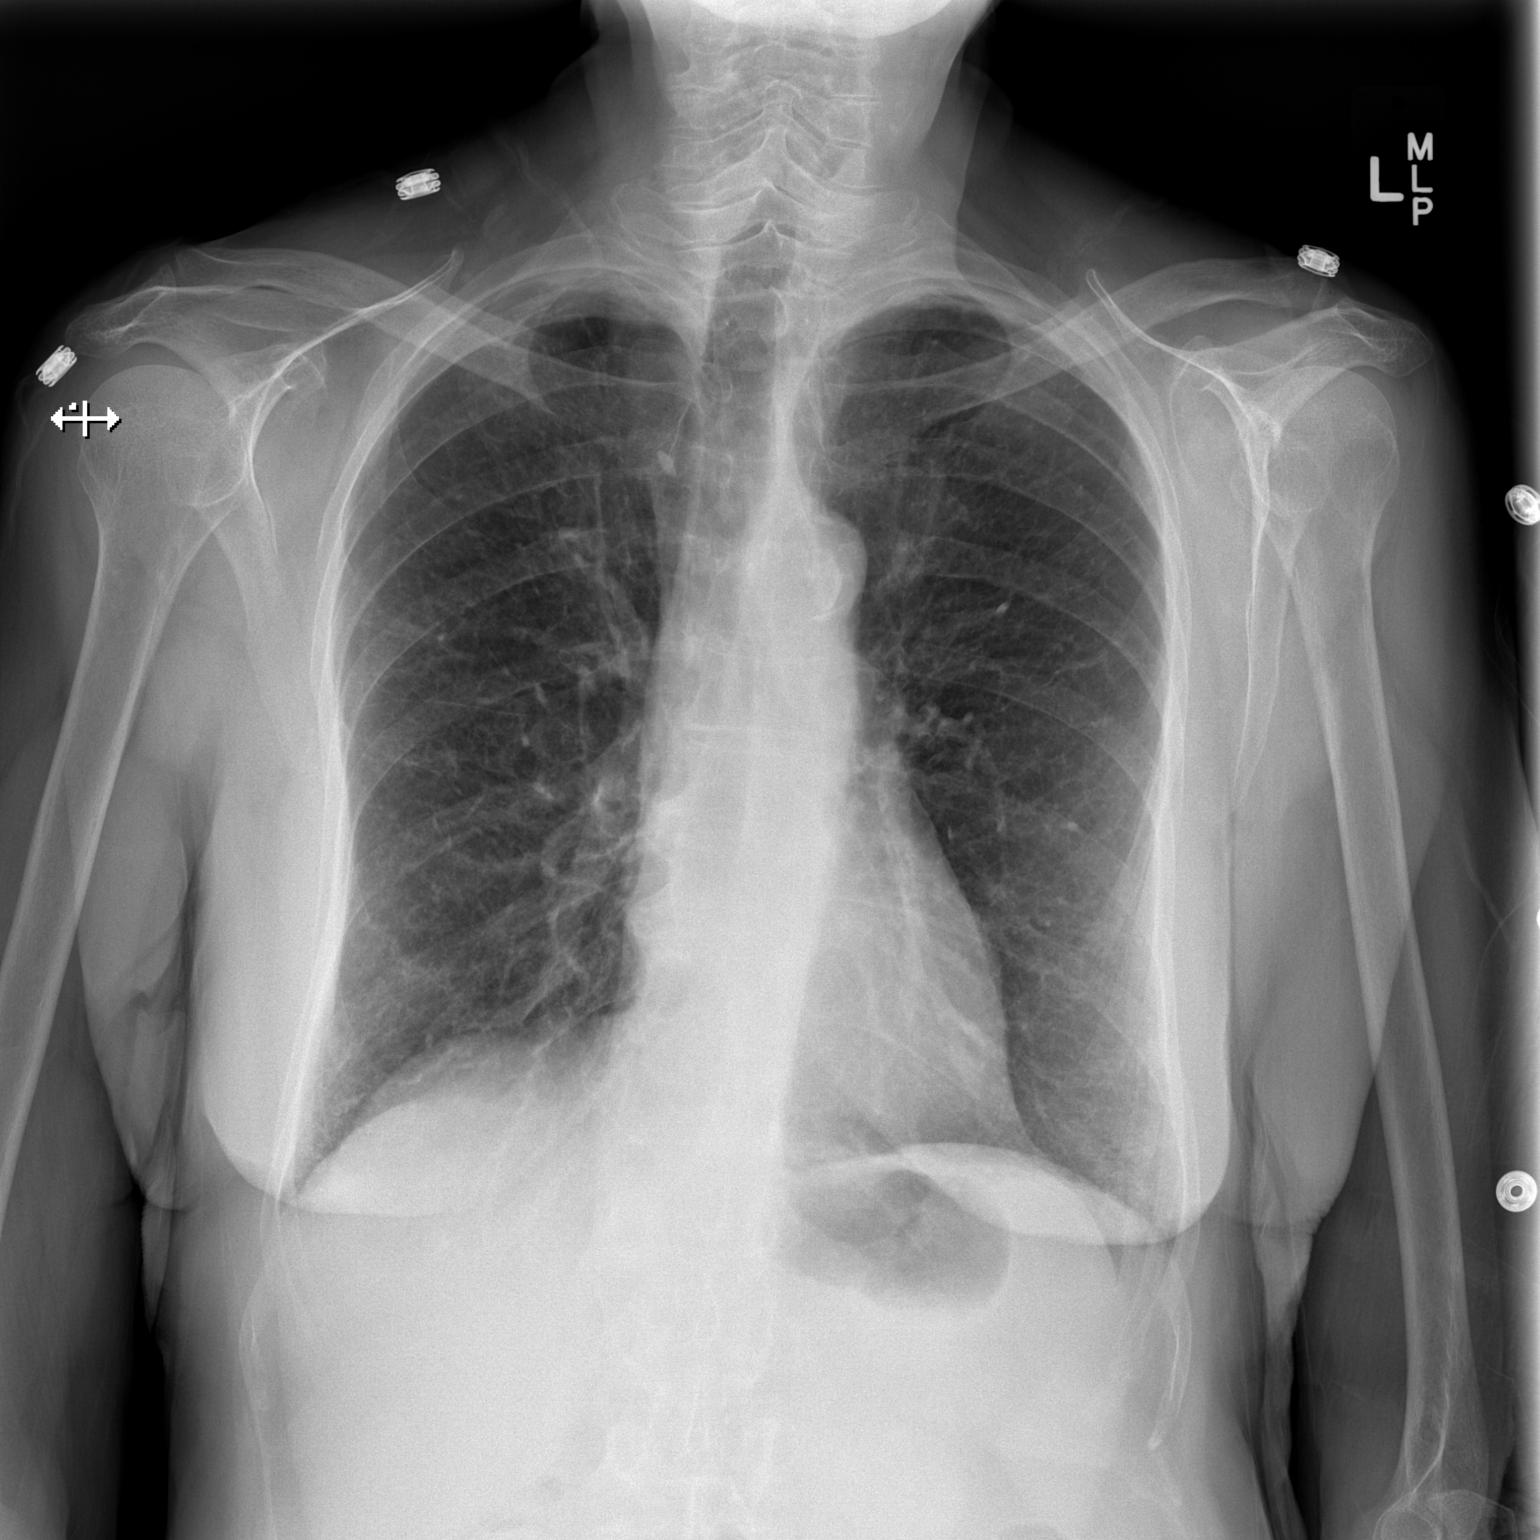

[w chest lat]
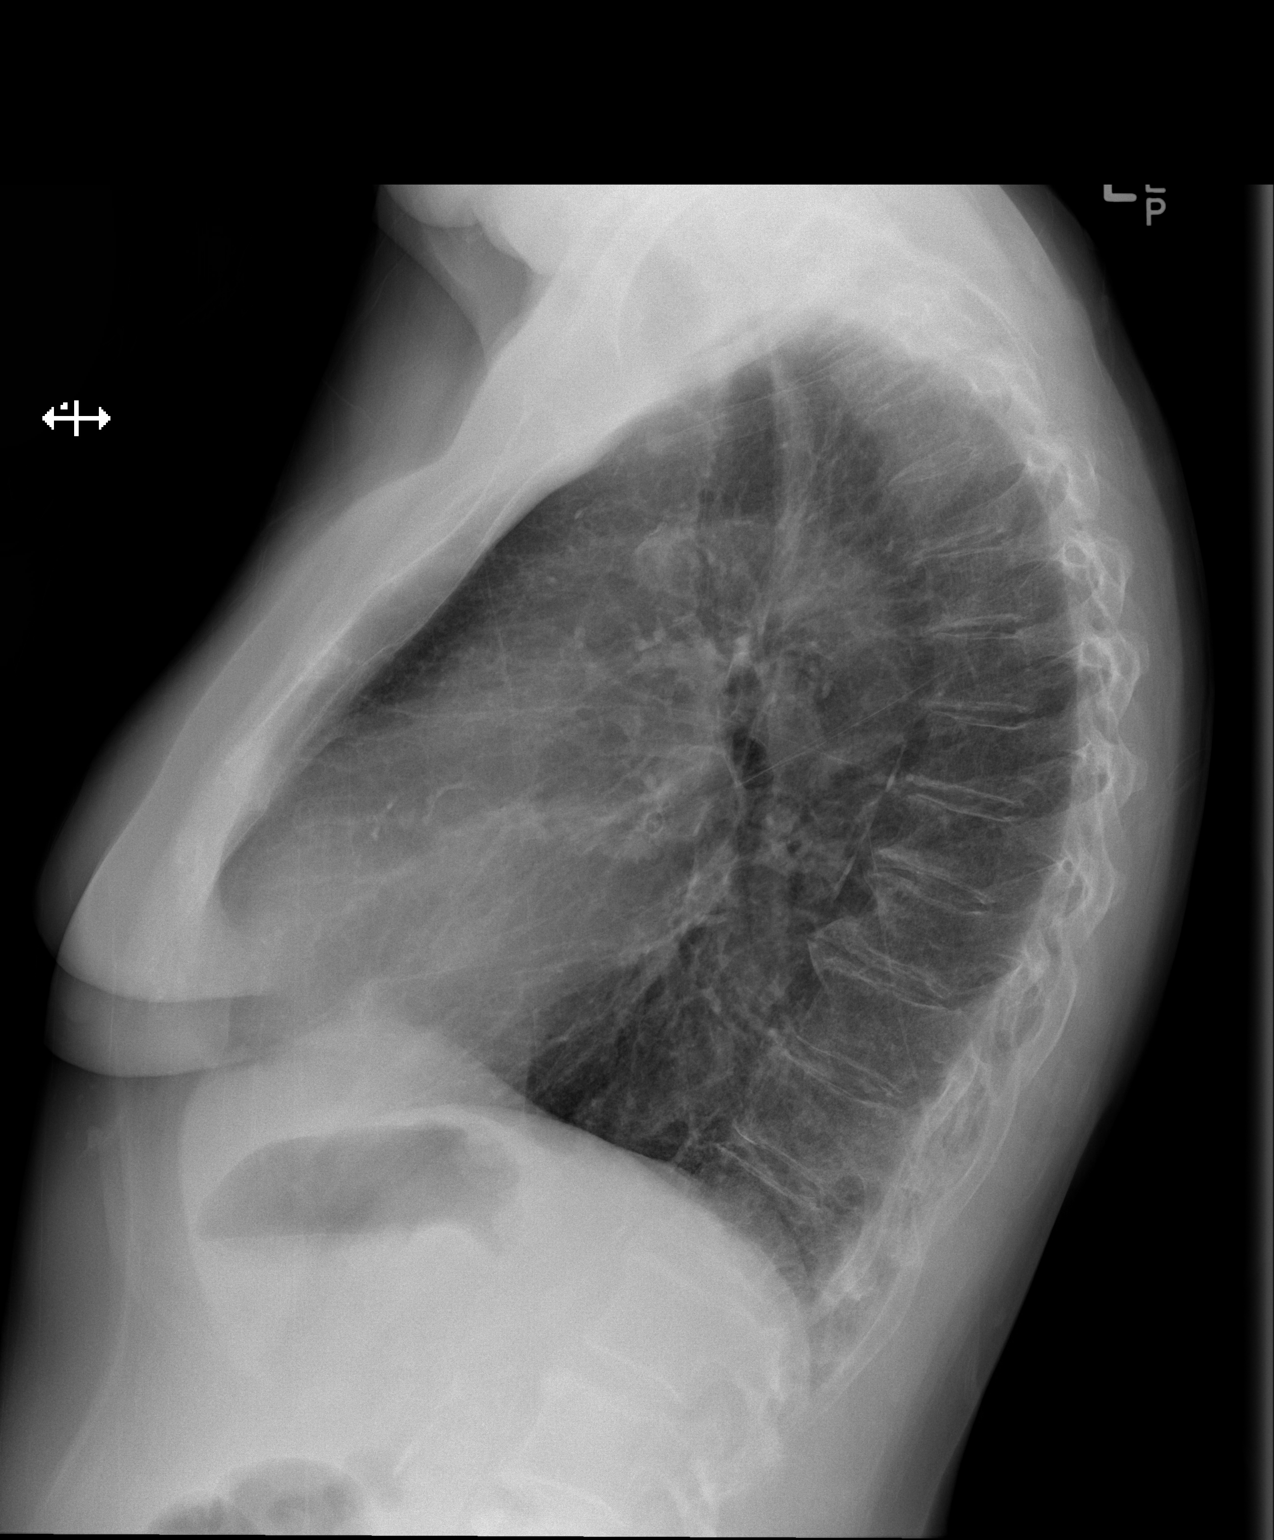

[2 of 2 positions shown; findings below may reference images not displayed]

FINDINGS: The cardiomediastinal contours are normal. Atherosclerosis noted of
the aorta. Mild hyperinflation and coarsened interstitial markings.
Pulmonary vasculature is normal. No consolidation, pleural effusion,
or pneumothorax. No acute osseous abnormalities are seen.
IMPRESSION: Mild bronchitic change, suspect this is chronic, however no prior
exams available for comparison. No acute process.

## 2016-07-29 DIAGNOSIS — I129 Hypertensive chronic kidney disease with stage 1 through stage 4 chronic kidney disease, or unspecified chronic kidney disease: Secondary | ICD-10-CM | POA: Diagnosis not present

## 2016-07-29 DIAGNOSIS — N184 Chronic kidney disease, stage 4 (severe): Secondary | ICD-10-CM | POA: Diagnosis not present

## 2016-07-29 DIAGNOSIS — I714 Abdominal aortic aneurysm, without rupture: Secondary | ICD-10-CM | POA: Diagnosis not present

## 2016-07-29 DIAGNOSIS — D631 Anemia in chronic kidney disease: Secondary | ICD-10-CM | POA: Diagnosis not present

## 2016-07-29 DIAGNOSIS — N189 Chronic kidney disease, unspecified: Secondary | ICD-10-CM | POA: Diagnosis not present

## 2016-07-29 DIAGNOSIS — N2581 Secondary hyperparathyroidism of renal origin: Secondary | ICD-10-CM | POA: Diagnosis not present

## 2016-09-12 DIAGNOSIS — M109 Gout, unspecified: Secondary | ICD-10-CM | POA: Diagnosis not present

## 2016-09-12 DIAGNOSIS — Z23 Encounter for immunization: Secondary | ICD-10-CM | POA: Diagnosis not present

## 2016-10-21 DIAGNOSIS — N2581 Secondary hyperparathyroidism of renal origin: Secondary | ICD-10-CM | POA: Diagnosis not present

## 2016-10-21 DIAGNOSIS — D631 Anemia in chronic kidney disease: Secondary | ICD-10-CM | POA: Diagnosis not present

## 2016-10-21 DIAGNOSIS — N184 Chronic kidney disease, stage 4 (severe): Secondary | ICD-10-CM | POA: Diagnosis not present

## 2016-10-21 DIAGNOSIS — I129 Hypertensive chronic kidney disease with stage 1 through stage 4 chronic kidney disease, or unspecified chronic kidney disease: Secondary | ICD-10-CM | POA: Diagnosis not present

## 2016-10-21 DIAGNOSIS — I714 Abdominal aortic aneurysm, without rupture: Secondary | ICD-10-CM | POA: Diagnosis not present

## 2016-10-24 DIAGNOSIS — M109 Gout, unspecified: Secondary | ICD-10-CM | POA: Diagnosis not present

## 2016-10-24 DIAGNOSIS — N184 Chronic kidney disease, stage 4 (severe): Secondary | ICD-10-CM | POA: Diagnosis not present

## 2016-10-24 DIAGNOSIS — I10 Essential (primary) hypertension: Secondary | ICD-10-CM | POA: Diagnosis not present

## 2016-10-24 DIAGNOSIS — E039 Hypothyroidism, unspecified: Secondary | ICD-10-CM | POA: Diagnosis not present

## 2017-01-27 DIAGNOSIS — N184 Chronic kidney disease, stage 4 (severe): Secondary | ICD-10-CM | POA: Diagnosis not present

## 2017-01-27 DIAGNOSIS — E039 Hypothyroidism, unspecified: Secondary | ICD-10-CM | POA: Diagnosis not present

## 2017-01-27 DIAGNOSIS — I129 Hypertensive chronic kidney disease with stage 1 through stage 4 chronic kidney disease, or unspecified chronic kidney disease: Secondary | ICD-10-CM | POA: Diagnosis not present

## 2017-01-27 DIAGNOSIS — D631 Anemia in chronic kidney disease: Secondary | ICD-10-CM | POA: Diagnosis not present

## 2017-01-27 DIAGNOSIS — E785 Hyperlipidemia, unspecified: Secondary | ICD-10-CM | POA: Diagnosis not present

## 2017-01-27 DIAGNOSIS — N2581 Secondary hyperparathyroidism of renal origin: Secondary | ICD-10-CM | POA: Diagnosis not present

## 2017-03-13 DIAGNOSIS — R079 Chest pain, unspecified: Secondary | ICD-10-CM | POA: Diagnosis not present

## 2017-03-13 DIAGNOSIS — R1013 Epigastric pain: Secondary | ICD-10-CM | POA: Diagnosis not present

## 2017-03-13 DIAGNOSIS — F419 Anxiety disorder, unspecified: Secondary | ICD-10-CM | POA: Diagnosis not present

## 2017-03-16 ENCOUNTER — Other Ambulatory Visit: Payer: Self-pay | Admitting: Family Medicine

## 2017-03-16 DIAGNOSIS — R1013 Epigastric pain: Secondary | ICD-10-CM

## 2017-03-24 ENCOUNTER — Ambulatory Visit
Admission: RE | Admit: 2017-03-24 | Discharge: 2017-03-24 | Disposition: A | Discharge status: Home / Self Care | Payer: Medicare Other | Source: Ambulatory Visit | Attending: Family Medicine | Admitting: Family Medicine

## 2017-03-24 DIAGNOSIS — R1011 Right upper quadrant pain: Secondary | ICD-10-CM | POA: Diagnosis not present

## 2017-03-24 DIAGNOSIS — R1013 Epigastric pain: Secondary | ICD-10-CM

## 2017-04-28 DIAGNOSIS — E039 Hypothyroidism, unspecified: Secondary | ICD-10-CM | POA: Diagnosis not present

## 2017-04-28 DIAGNOSIS — Z0000 Encounter for general adult medical examination without abnormal findings: Secondary | ICD-10-CM | POA: Diagnosis not present

## 2017-04-28 DIAGNOSIS — N184 Chronic kidney disease, stage 4 (severe): Secondary | ICD-10-CM | POA: Diagnosis not present

## 2017-04-28 DIAGNOSIS — E785 Hyperlipidemia, unspecified: Secondary | ICD-10-CM | POA: Diagnosis not present

## 2017-04-28 DIAGNOSIS — I10 Essential (primary) hypertension: Secondary | ICD-10-CM | POA: Diagnosis not present

## 2017-05-08 DIAGNOSIS — Z0000 Encounter for general adult medical examination without abnormal findings: Secondary | ICD-10-CM | POA: Diagnosis not present

## 2017-05-08 DIAGNOSIS — N2581 Secondary hyperparathyroidism of renal origin: Secondary | ICD-10-CM | POA: Diagnosis not present

## 2017-05-08 DIAGNOSIS — E039 Hypothyroidism, unspecified: Secondary | ICD-10-CM | POA: Diagnosis not present

## 2017-05-08 DIAGNOSIS — I714 Abdominal aortic aneurysm, without rupture: Secondary | ICD-10-CM | POA: Diagnosis not present

## 2017-05-08 DIAGNOSIS — D631 Anemia in chronic kidney disease: Secondary | ICD-10-CM | POA: Diagnosis not present

## 2017-05-08 DIAGNOSIS — N184 Chronic kidney disease, stage 4 (severe): Secondary | ICD-10-CM | POA: Diagnosis not present

## 2017-05-08 DIAGNOSIS — I129 Hypertensive chronic kidney disease with stage 1 through stage 4 chronic kidney disease, or unspecified chronic kidney disease: Secondary | ICD-10-CM | POA: Diagnosis not present

## 2017-05-08 DIAGNOSIS — E785 Hyperlipidemia, unspecified: Secondary | ICD-10-CM | POA: Diagnosis not present

## 2017-06-23 DIAGNOSIS — I129 Hypertensive chronic kidney disease with stage 1 through stage 4 chronic kidney disease, or unspecified chronic kidney disease: Secondary | ICD-10-CM | POA: Diagnosis not present

## 2017-08-18 DIAGNOSIS — J209 Acute bronchitis, unspecified: Secondary | ICD-10-CM | POA: Diagnosis not present

## 2017-09-09 DIAGNOSIS — Z23 Encounter for immunization: Secondary | ICD-10-CM | POA: Diagnosis not present

## 2017-09-22 DIAGNOSIS — I129 Hypertensive chronic kidney disease with stage 1 through stage 4 chronic kidney disease, or unspecified chronic kidney disease: Secondary | ICD-10-CM | POA: Diagnosis not present

## 2017-09-22 DIAGNOSIS — Z0000 Encounter for general adult medical examination without abnormal findings: Secondary | ICD-10-CM | POA: Diagnosis not present

## 2017-09-22 DIAGNOSIS — I714 Abdominal aortic aneurysm, without rupture: Secondary | ICD-10-CM | POA: Diagnosis not present

## 2017-09-22 DIAGNOSIS — D631 Anemia in chronic kidney disease: Secondary | ICD-10-CM | POA: Diagnosis not present

## 2017-09-22 DIAGNOSIS — E785 Hyperlipidemia, unspecified: Secondary | ICD-10-CM | POA: Diagnosis not present

## 2017-09-22 DIAGNOSIS — N2581 Secondary hyperparathyroidism of renal origin: Secondary | ICD-10-CM | POA: Diagnosis not present

## 2017-09-22 DIAGNOSIS — N184 Chronic kidney disease, stage 4 (severe): Secondary | ICD-10-CM | POA: Diagnosis not present

## 2017-09-22 DIAGNOSIS — E039 Hypothyroidism, unspecified: Secondary | ICD-10-CM | POA: Diagnosis not present

## 2017-10-27 DIAGNOSIS — M109 Gout, unspecified: Secondary | ICD-10-CM | POA: Diagnosis not present

## 2017-10-27 DIAGNOSIS — M7918 Myalgia, other site: Secondary | ICD-10-CM | POA: Diagnosis not present

## 2017-10-27 DIAGNOSIS — D510 Vitamin B12 deficiency anemia due to intrinsic factor deficiency: Secondary | ICD-10-CM | POA: Diagnosis not present

## 2017-10-27 DIAGNOSIS — N184 Chronic kidney disease, stage 4 (severe): Secondary | ICD-10-CM | POA: Diagnosis not present

## 2017-10-27 DIAGNOSIS — I10 Essential (primary) hypertension: Secondary | ICD-10-CM | POA: Diagnosis not present

## 2017-10-27 DIAGNOSIS — E039 Hypothyroidism, unspecified: Secondary | ICD-10-CM | POA: Diagnosis not present

## 2017-10-27 DIAGNOSIS — R195 Other fecal abnormalities: Secondary | ICD-10-CM | POA: Diagnosis not present

## 2017-12-15 DIAGNOSIS — E039 Hypothyroidism, unspecified: Secondary | ICD-10-CM | POA: Diagnosis not present

## 2018-03-30 DIAGNOSIS — N2581 Secondary hyperparathyroidism of renal origin: Secondary | ICD-10-CM | POA: Diagnosis not present

## 2018-03-30 DIAGNOSIS — I714 Abdominal aortic aneurysm, without rupture: Secondary | ICD-10-CM | POA: Diagnosis not present

## 2018-03-30 DIAGNOSIS — Z0000 Encounter for general adult medical examination without abnormal findings: Secondary | ICD-10-CM | POA: Diagnosis not present

## 2018-03-30 DIAGNOSIS — I129 Hypertensive chronic kidney disease with stage 1 through stage 4 chronic kidney disease, or unspecified chronic kidney disease: Secondary | ICD-10-CM | POA: Diagnosis not present

## 2018-03-30 DIAGNOSIS — E039 Hypothyroidism, unspecified: Secondary | ICD-10-CM | POA: Diagnosis not present

## 2018-03-30 DIAGNOSIS — N184 Chronic kidney disease, stage 4 (severe): Secondary | ICD-10-CM | POA: Diagnosis not present

## 2018-03-30 DIAGNOSIS — D631 Anemia in chronic kidney disease: Secondary | ICD-10-CM | POA: Diagnosis not present

## 2018-03-30 DIAGNOSIS — E785 Hyperlipidemia, unspecified: Secondary | ICD-10-CM | POA: Diagnosis not present

## 2018-05-18 DIAGNOSIS — F419 Anxiety disorder, unspecified: Secondary | ICD-10-CM | POA: Diagnosis not present

## 2018-05-18 DIAGNOSIS — Z0000 Encounter for general adult medical examination without abnormal findings: Secondary | ICD-10-CM | POA: Diagnosis not present

## 2018-05-18 DIAGNOSIS — I714 Abdominal aortic aneurysm, without rupture: Secondary | ICD-10-CM | POA: Diagnosis not present

## 2018-05-18 DIAGNOSIS — D510 Vitamin B12 deficiency anemia due to intrinsic factor deficiency: Secondary | ICD-10-CM | POA: Diagnosis not present

## 2018-05-18 DIAGNOSIS — N184 Chronic kidney disease, stage 4 (severe): Secondary | ICD-10-CM | POA: Diagnosis not present

## 2018-05-18 DIAGNOSIS — I10 Essential (primary) hypertension: Secondary | ICD-10-CM | POA: Diagnosis not present

## 2018-05-18 DIAGNOSIS — E785 Hyperlipidemia, unspecified: Secondary | ICD-10-CM | POA: Diagnosis not present

## 2018-05-18 DIAGNOSIS — E039 Hypothyroidism, unspecified: Secondary | ICD-10-CM | POA: Diagnosis not present

## 2018-09-14 DIAGNOSIS — N184 Chronic kidney disease, stage 4 (severe): Secondary | ICD-10-CM | POA: Diagnosis not present

## 2018-09-14 DIAGNOSIS — D631 Anemia in chronic kidney disease: Secondary | ICD-10-CM | POA: Diagnosis not present

## 2018-09-14 DIAGNOSIS — N2581 Secondary hyperparathyroidism of renal origin: Secondary | ICD-10-CM | POA: Diagnosis not present

## 2018-09-14 DIAGNOSIS — Q600 Renal agenesis, unilateral: Secondary | ICD-10-CM | POA: Diagnosis not present

## 2018-09-14 DIAGNOSIS — I129 Hypertensive chronic kidney disease with stage 1 through stage 4 chronic kidney disease, or unspecified chronic kidney disease: Secondary | ICD-10-CM | POA: Diagnosis not present

## 2018-10-26 DIAGNOSIS — Z23 Encounter for immunization: Secondary | ICD-10-CM | POA: Diagnosis not present

## 2018-11-23 DIAGNOSIS — D510 Vitamin B12 deficiency anemia due to intrinsic factor deficiency: Secondary | ICD-10-CM | POA: Diagnosis not present

## 2018-11-23 DIAGNOSIS — E039 Hypothyroidism, unspecified: Secondary | ICD-10-CM | POA: Diagnosis not present

## 2018-11-23 DIAGNOSIS — N184 Chronic kidney disease, stage 4 (severe): Secondary | ICD-10-CM | POA: Diagnosis not present

## 2018-11-23 DIAGNOSIS — I714 Abdominal aortic aneurysm, without rupture: Secondary | ICD-10-CM | POA: Diagnosis not present

## 2018-11-23 DIAGNOSIS — I10 Essential (primary) hypertension: Secondary | ICD-10-CM | POA: Diagnosis not present

## 2019-01-18 DIAGNOSIS — D510 Vitamin B12 deficiency anemia due to intrinsic factor deficiency: Secondary | ICD-10-CM | POA: Diagnosis not present

## 2019-01-18 DIAGNOSIS — E039 Hypothyroidism, unspecified: Secondary | ICD-10-CM | POA: Diagnosis not present

## 2019-01-21 DIAGNOSIS — N184 Chronic kidney disease, stage 4 (severe): Secondary | ICD-10-CM | POA: Diagnosis not present

## 2019-02-01 DIAGNOSIS — N2581 Secondary hyperparathyroidism of renal origin: Secondary | ICD-10-CM | POA: Diagnosis not present

## 2019-02-01 DIAGNOSIS — D631 Anemia in chronic kidney disease: Secondary | ICD-10-CM | POA: Diagnosis not present

## 2019-02-01 DIAGNOSIS — Q600 Renal agenesis, unilateral: Secondary | ICD-10-CM | POA: Diagnosis not present

## 2019-02-01 DIAGNOSIS — N184 Chronic kidney disease, stage 4 (severe): Secondary | ICD-10-CM | POA: Diagnosis not present

## 2019-02-01 DIAGNOSIS — I129 Hypertensive chronic kidney disease with stage 1 through stage 4 chronic kidney disease, or unspecified chronic kidney disease: Secondary | ICD-10-CM | POA: Diagnosis not present

## 2019-02-06 ENCOUNTER — Ambulatory Visit: Payer: 59 | Admitting: Gastroenterology

## 2019-03-08 DIAGNOSIS — E039 Hypothyroidism, unspecified: Secondary | ICD-10-CM | POA: Diagnosis not present

## 2019-05-17 DIAGNOSIS — F419 Anxiety disorder, unspecified: Secondary | ICD-10-CM | POA: Diagnosis not present

## 2019-05-17 DIAGNOSIS — I10 Essential (primary) hypertension: Secondary | ICD-10-CM | POA: Diagnosis not present

## 2019-05-17 DIAGNOSIS — E785 Hyperlipidemia, unspecified: Secondary | ICD-10-CM | POA: Diagnosis not present

## 2019-05-17 DIAGNOSIS — D510 Vitamin B12 deficiency anemia due to intrinsic factor deficiency: Secondary | ICD-10-CM | POA: Diagnosis not present

## 2019-05-17 DIAGNOSIS — E039 Hypothyroidism, unspecified: Secondary | ICD-10-CM | POA: Diagnosis not present

## 2019-05-17 DIAGNOSIS — Z0000 Encounter for general adult medical examination without abnormal findings: Secondary | ICD-10-CM | POA: Diagnosis not present

## 2019-05-17 DIAGNOSIS — I714 Abdominal aortic aneurysm, without rupture: Secondary | ICD-10-CM | POA: Diagnosis not present

## 2019-05-17 DIAGNOSIS — N184 Chronic kidney disease, stage 4 (severe): Secondary | ICD-10-CM | POA: Diagnosis not present

## 2019-05-31 DIAGNOSIS — Q600 Renal agenesis, unilateral: Secondary | ICD-10-CM | POA: Diagnosis not present

## 2019-05-31 DIAGNOSIS — D631 Anemia in chronic kidney disease: Secondary | ICD-10-CM | POA: Diagnosis not present

## 2019-05-31 DIAGNOSIS — I129 Hypertensive chronic kidney disease with stage 1 through stage 4 chronic kidney disease, or unspecified chronic kidney disease: Secondary | ICD-10-CM | POA: Diagnosis not present

## 2019-05-31 DIAGNOSIS — N184 Chronic kidney disease, stage 4 (severe): Secondary | ICD-10-CM | POA: Diagnosis not present

## 2019-05-31 DIAGNOSIS — N2581 Secondary hyperparathyroidism of renal origin: Secondary | ICD-10-CM | POA: Diagnosis not present

## 2019-09-15 DIAGNOSIS — Z23 Encounter for immunization: Secondary | ICD-10-CM | POA: Diagnosis not present

## 2019-09-20 DIAGNOSIS — N184 Chronic kidney disease, stage 4 (severe): Secondary | ICD-10-CM | POA: Diagnosis not present

## 2019-09-27 DIAGNOSIS — I129 Hypertensive chronic kidney disease with stage 1 through stage 4 chronic kidney disease, or unspecified chronic kidney disease: Secondary | ICD-10-CM | POA: Diagnosis not present

## 2019-09-27 DIAGNOSIS — D631 Anemia in chronic kidney disease: Secondary | ICD-10-CM | POA: Diagnosis not present

## 2019-09-27 DIAGNOSIS — N184 Chronic kidney disease, stage 4 (severe): Secondary | ICD-10-CM | POA: Diagnosis not present

## 2019-09-27 DIAGNOSIS — N2581 Secondary hyperparathyroidism of renal origin: Secondary | ICD-10-CM | POA: Diagnosis not present

## 2019-09-27 DIAGNOSIS — Q600 Renal agenesis, unilateral: Secondary | ICD-10-CM | POA: Diagnosis not present

## 2019-09-27 DIAGNOSIS — E875 Hyperkalemia: Secondary | ICD-10-CM | POA: Diagnosis not present

## 2019-11-22 DIAGNOSIS — I714 Abdominal aortic aneurysm, without rupture: Secondary | ICD-10-CM | POA: Diagnosis not present

## 2019-11-22 DIAGNOSIS — I10 Essential (primary) hypertension: Secondary | ICD-10-CM | POA: Diagnosis not present

## 2019-11-22 DIAGNOSIS — E039 Hypothyroidism, unspecified: Secondary | ICD-10-CM | POA: Diagnosis not present

## 2019-11-22 DIAGNOSIS — N184 Chronic kidney disease, stage 4 (severe): Secondary | ICD-10-CM | POA: Diagnosis not present

## 2019-11-22 DIAGNOSIS — D510 Vitamin B12 deficiency anemia due to intrinsic factor deficiency: Secondary | ICD-10-CM | POA: Diagnosis not present

## 2020-01-10 DIAGNOSIS — D631 Anemia in chronic kidney disease: Secondary | ICD-10-CM | POA: Diagnosis not present

## 2020-01-10 DIAGNOSIS — N184 Chronic kidney disease, stage 4 (severe): Secondary | ICD-10-CM | POA: Diagnosis not present

## 2020-01-17 DIAGNOSIS — E875 Hyperkalemia: Secondary | ICD-10-CM | POA: Diagnosis not present

## 2020-01-17 DIAGNOSIS — N2581 Secondary hyperparathyroidism of renal origin: Secondary | ICD-10-CM | POA: Diagnosis not present

## 2020-01-17 DIAGNOSIS — D631 Anemia in chronic kidney disease: Secondary | ICD-10-CM | POA: Diagnosis not present

## 2020-01-17 DIAGNOSIS — I129 Hypertensive chronic kidney disease with stage 1 through stage 4 chronic kidney disease, or unspecified chronic kidney disease: Secondary | ICD-10-CM | POA: Diagnosis not present

## 2020-01-17 DIAGNOSIS — N184 Chronic kidney disease, stage 4 (severe): Secondary | ICD-10-CM | POA: Diagnosis not present

## 2020-05-15 DIAGNOSIS — N184 Chronic kidney disease, stage 4 (severe): Secondary | ICD-10-CM | POA: Diagnosis not present

## 2020-05-22 DIAGNOSIS — N2581 Secondary hyperparathyroidism of renal origin: Secondary | ICD-10-CM | POA: Diagnosis not present

## 2020-05-22 DIAGNOSIS — D631 Anemia in chronic kidney disease: Secondary | ICD-10-CM | POA: Diagnosis not present

## 2020-05-22 DIAGNOSIS — N184 Chronic kidney disease, stage 4 (severe): Secondary | ICD-10-CM | POA: Diagnosis not present

## 2020-05-22 DIAGNOSIS — I129 Hypertensive chronic kidney disease with stage 1 through stage 4 chronic kidney disease, or unspecified chronic kidney disease: Secondary | ICD-10-CM | POA: Diagnosis not present

## 2020-05-22 DIAGNOSIS — E875 Hyperkalemia: Secondary | ICD-10-CM | POA: Diagnosis not present

## 2020-08-05 DIAGNOSIS — F419 Anxiety disorder, unspecified: Secondary | ICD-10-CM | POA: Diagnosis not present

## 2020-08-05 DIAGNOSIS — I714 Abdominal aortic aneurysm, without rupture: Secondary | ICD-10-CM | POA: Diagnosis not present

## 2020-08-05 DIAGNOSIS — D510 Vitamin B12 deficiency anemia due to intrinsic factor deficiency: Secondary | ICD-10-CM | POA: Diagnosis not present

## 2020-08-05 DIAGNOSIS — E785 Hyperlipidemia, unspecified: Secondary | ICD-10-CM | POA: Diagnosis not present

## 2020-08-05 DIAGNOSIS — E039 Hypothyroidism, unspecified: Secondary | ICD-10-CM | POA: Diagnosis not present

## 2020-08-05 DIAGNOSIS — N184 Chronic kidney disease, stage 4 (severe): Secondary | ICD-10-CM | POA: Diagnosis not present

## 2020-08-05 DIAGNOSIS — I10 Essential (primary) hypertension: Secondary | ICD-10-CM | POA: Diagnosis not present

## 2020-08-05 DIAGNOSIS — Z0000 Encounter for general adult medical examination without abnormal findings: Secondary | ICD-10-CM | POA: Diagnosis not present

## 2020-09-25 DIAGNOSIS — N184 Chronic kidney disease, stage 4 (severe): Secondary | ICD-10-CM | POA: Diagnosis not present

## 2020-10-02 DIAGNOSIS — D631 Anemia in chronic kidney disease: Secondary | ICD-10-CM | POA: Diagnosis not present

## 2020-10-02 DIAGNOSIS — N184 Chronic kidney disease, stage 4 (severe): Secondary | ICD-10-CM | POA: Diagnosis not present

## 2020-10-02 DIAGNOSIS — E875 Hyperkalemia: Secondary | ICD-10-CM | POA: Diagnosis not present

## 2020-10-02 DIAGNOSIS — I129 Hypertensive chronic kidney disease with stage 1 through stage 4 chronic kidney disease, or unspecified chronic kidney disease: Secondary | ICD-10-CM | POA: Diagnosis not present

## 2020-10-02 DIAGNOSIS — N2581 Secondary hyperparathyroidism of renal origin: Secondary | ICD-10-CM | POA: Diagnosis not present

## 2020-10-02 DIAGNOSIS — Z23 Encounter for immunization: Secondary | ICD-10-CM | POA: Diagnosis not present

## 2021-02-05 DIAGNOSIS — E039 Hypothyroidism, unspecified: Secondary | ICD-10-CM | POA: Diagnosis not present

## 2021-02-05 DIAGNOSIS — N184 Chronic kidney disease, stage 4 (severe): Secondary | ICD-10-CM | POA: Diagnosis not present

## 2021-02-05 DIAGNOSIS — I10 Essential (primary) hypertension: Secondary | ICD-10-CM | POA: Diagnosis not present

## 2021-02-05 DIAGNOSIS — E785 Hyperlipidemia, unspecified: Secondary | ICD-10-CM | POA: Diagnosis not present

## 2021-02-05 DIAGNOSIS — F419 Anxiety disorder, unspecified: Secondary | ICD-10-CM | POA: Diagnosis not present

## 2021-02-05 DIAGNOSIS — D510 Vitamin B12 deficiency anemia due to intrinsic factor deficiency: Secondary | ICD-10-CM | POA: Diagnosis not present

## 2021-02-19 DIAGNOSIS — N184 Chronic kidney disease, stage 4 (severe): Secondary | ICD-10-CM | POA: Diagnosis not present

## 2021-02-26 DIAGNOSIS — N2581 Secondary hyperparathyroidism of renal origin: Secondary | ICD-10-CM | POA: Diagnosis not present

## 2021-02-26 DIAGNOSIS — N184 Chronic kidney disease, stage 4 (severe): Secondary | ICD-10-CM | POA: Diagnosis not present

## 2021-02-26 DIAGNOSIS — I129 Hypertensive chronic kidney disease with stage 1 through stage 4 chronic kidney disease, or unspecified chronic kidney disease: Secondary | ICD-10-CM | POA: Diagnosis not present

## 2021-02-26 DIAGNOSIS — D631 Anemia in chronic kidney disease: Secondary | ICD-10-CM | POA: Diagnosis not present

## 2021-02-26 DIAGNOSIS — E875 Hyperkalemia: Secondary | ICD-10-CM | POA: Diagnosis not present

## 2021-06-11 DIAGNOSIS — N184 Chronic kidney disease, stage 4 (severe): Secondary | ICD-10-CM | POA: Diagnosis not present

## 2021-06-18 DIAGNOSIS — E875 Hyperkalemia: Secondary | ICD-10-CM | POA: Diagnosis not present

## 2021-06-18 DIAGNOSIS — N184 Chronic kidney disease, stage 4 (severe): Secondary | ICD-10-CM | POA: Diagnosis not present

## 2021-06-18 DIAGNOSIS — D631 Anemia in chronic kidney disease: Secondary | ICD-10-CM | POA: Diagnosis not present

## 2021-06-18 DIAGNOSIS — I129 Hypertensive chronic kidney disease with stage 1 through stage 4 chronic kidney disease, or unspecified chronic kidney disease: Secondary | ICD-10-CM | POA: Diagnosis not present

## 2021-06-18 DIAGNOSIS — N2581 Secondary hyperparathyroidism of renal origin: Secondary | ICD-10-CM | POA: Diagnosis not present

## 2021-08-18 DIAGNOSIS — I714 Abdominal aortic aneurysm, without rupture, unspecified: Secondary | ICD-10-CM | POA: Diagnosis not present

## 2021-08-18 DIAGNOSIS — I10 Essential (primary) hypertension: Secondary | ICD-10-CM | POA: Diagnosis not present

## 2021-08-18 DIAGNOSIS — E039 Hypothyroidism, unspecified: Secondary | ICD-10-CM | POA: Diagnosis not present

## 2021-08-18 DIAGNOSIS — F419 Anxiety disorder, unspecified: Secondary | ICD-10-CM | POA: Diagnosis not present

## 2021-08-18 DIAGNOSIS — D510 Vitamin B12 deficiency anemia due to intrinsic factor deficiency: Secondary | ICD-10-CM | POA: Diagnosis not present

## 2021-08-18 DIAGNOSIS — E785 Hyperlipidemia, unspecified: Secondary | ICD-10-CM | POA: Diagnosis not present

## 2021-08-18 DIAGNOSIS — N184 Chronic kidney disease, stage 4 (severe): Secondary | ICD-10-CM | POA: Diagnosis not present

## 2021-08-18 DIAGNOSIS — Z0000 Encounter for general adult medical examination without abnormal findings: Secondary | ICD-10-CM | POA: Diagnosis not present

## 2021-09-17 DIAGNOSIS — Z23 Encounter for immunization: Secondary | ICD-10-CM | POA: Diagnosis not present

## 2021-11-12 DIAGNOSIS — N184 Chronic kidney disease, stage 4 (severe): Secondary | ICD-10-CM | POA: Diagnosis not present

## 2021-11-19 DIAGNOSIS — E875 Hyperkalemia: Secondary | ICD-10-CM | POA: Diagnosis not present

## 2021-11-19 DIAGNOSIS — I129 Hypertensive chronic kidney disease with stage 1 through stage 4 chronic kidney disease, or unspecified chronic kidney disease: Secondary | ICD-10-CM | POA: Diagnosis not present

## 2021-11-19 DIAGNOSIS — N2581 Secondary hyperparathyroidism of renal origin: Secondary | ICD-10-CM | POA: Diagnosis not present

## 2021-11-19 DIAGNOSIS — D631 Anemia in chronic kidney disease: Secondary | ICD-10-CM | POA: Diagnosis not present

## 2021-11-19 DIAGNOSIS — N184 Chronic kidney disease, stage 4 (severe): Secondary | ICD-10-CM | POA: Diagnosis not present

## 2022-02-25 DIAGNOSIS — S42292A Other displaced fracture of upper end of left humerus, initial encounter for closed fracture: Secondary | ICD-10-CM | POA: Diagnosis not present

## 2022-02-25 DIAGNOSIS — Y998 Other external cause status: Secondary | ICD-10-CM | POA: Diagnosis not present

## 2022-02-25 DIAGNOSIS — M79602 Pain in left arm: Secondary | ICD-10-CM | POA: Diagnosis not present

## 2022-02-25 DIAGNOSIS — W1789XA Other fall from one level to another, initial encounter: Secondary | ICD-10-CM | POA: Diagnosis not present

## 2022-02-25 DIAGNOSIS — R079 Chest pain, unspecified: Secondary | ICD-10-CM | POA: Diagnosis not present

## 2022-02-25 DIAGNOSIS — R030 Elevated blood-pressure reading, without diagnosis of hypertension: Secondary | ICD-10-CM | POA: Diagnosis not present

## 2022-02-25 DIAGNOSIS — S42202A Unspecified fracture of upper end of left humerus, initial encounter for closed fracture: Secondary | ICD-10-CM | POA: Diagnosis not present

## 2022-03-01 DIAGNOSIS — S42252A Displaced fracture of greater tuberosity of left humerus, initial encounter for closed fracture: Secondary | ICD-10-CM | POA: Diagnosis not present

## 2022-03-04 DIAGNOSIS — R110 Nausea: Secondary | ICD-10-CM | POA: Diagnosis not present

## 2022-03-04 DIAGNOSIS — F419 Anxiety disorder, unspecified: Secondary | ICD-10-CM | POA: Diagnosis not present

## 2022-03-04 DIAGNOSIS — R197 Diarrhea, unspecified: Secondary | ICD-10-CM | POA: Diagnosis not present

## 2022-03-04 DIAGNOSIS — S42293A Other displaced fracture of upper end of unspecified humerus, initial encounter for closed fracture: Secondary | ICD-10-CM | POA: Diagnosis not present

## 2022-03-04 DIAGNOSIS — N184 Chronic kidney disease, stage 4 (severe): Secondary | ICD-10-CM | POA: Diagnosis not present

## 2022-03-04 DIAGNOSIS — R531 Weakness: Secondary | ICD-10-CM | POA: Diagnosis not present

## 2022-03-08 DIAGNOSIS — S42252A Displaced fracture of greater tuberosity of left humerus, initial encounter for closed fracture: Secondary | ICD-10-CM | POA: Diagnosis not present

## 2022-04-21 DIAGNOSIS — S42252A Displaced fracture of greater tuberosity of left humerus, initial encounter for closed fracture: Secondary | ICD-10-CM | POA: Diagnosis not present

## 2022-05-11 DIAGNOSIS — D631 Anemia in chronic kidney disease: Secondary | ICD-10-CM | POA: Diagnosis not present

## 2022-05-11 DIAGNOSIS — N184 Chronic kidney disease, stage 4 (severe): Secondary | ICD-10-CM | POA: Diagnosis not present

## 2022-05-11 DIAGNOSIS — E875 Hyperkalemia: Secondary | ICD-10-CM | POA: Diagnosis not present

## 2022-05-11 DIAGNOSIS — N2581 Secondary hyperparathyroidism of renal origin: Secondary | ICD-10-CM | POA: Diagnosis not present

## 2022-05-11 DIAGNOSIS — I129 Hypertensive chronic kidney disease with stage 1 through stage 4 chronic kidney disease, or unspecified chronic kidney disease: Secondary | ICD-10-CM | POA: Diagnosis not present

## 2022-09-19 DIAGNOSIS — D510 Vitamin B12 deficiency anemia due to intrinsic factor deficiency: Secondary | ICD-10-CM | POA: Diagnosis not present

## 2022-09-19 DIAGNOSIS — Z0000 Encounter for general adult medical examination without abnormal findings: Secondary | ICD-10-CM | POA: Diagnosis not present

## 2022-09-19 DIAGNOSIS — F419 Anxiety disorder, unspecified: Secondary | ICD-10-CM | POA: Diagnosis not present

## 2022-09-19 DIAGNOSIS — E785 Hyperlipidemia, unspecified: Secondary | ICD-10-CM | POA: Diagnosis not present

## 2022-09-19 DIAGNOSIS — R002 Palpitations: Secondary | ICD-10-CM | POA: Diagnosis not present

## 2022-09-19 DIAGNOSIS — M549 Dorsalgia, unspecified: Secondary | ICD-10-CM | POA: Diagnosis not present

## 2022-09-19 DIAGNOSIS — I4891 Unspecified atrial fibrillation: Secondary | ICD-10-CM | POA: Diagnosis not present

## 2022-09-19 DIAGNOSIS — Z23 Encounter for immunization: Secondary | ICD-10-CM | POA: Diagnosis not present

## 2022-09-19 DIAGNOSIS — E039 Hypothyroidism, unspecified: Secondary | ICD-10-CM | POA: Diagnosis not present

## 2022-09-19 DIAGNOSIS — I10 Essential (primary) hypertension: Secondary | ICD-10-CM | POA: Diagnosis not present

## 2022-09-19 DIAGNOSIS — N184 Chronic kidney disease, stage 4 (severe): Secondary | ICD-10-CM | POA: Diagnosis not present

## 2022-09-30 DIAGNOSIS — N2581 Secondary hyperparathyroidism of renal origin: Secondary | ICD-10-CM | POA: Diagnosis not present

## 2022-09-30 DIAGNOSIS — I129 Hypertensive chronic kidney disease with stage 1 through stage 4 chronic kidney disease, or unspecified chronic kidney disease: Secondary | ICD-10-CM | POA: Diagnosis not present

## 2022-09-30 DIAGNOSIS — N184 Chronic kidney disease, stage 4 (severe): Secondary | ICD-10-CM | POA: Diagnosis not present

## 2022-09-30 DIAGNOSIS — E875 Hyperkalemia: Secondary | ICD-10-CM | POA: Diagnosis not present

## 2022-09-30 DIAGNOSIS — D631 Anemia in chronic kidney disease: Secondary | ICD-10-CM | POA: Diagnosis not present

## 2022-10-21 DIAGNOSIS — I4891 Unspecified atrial fibrillation: Secondary | ICD-10-CM | POA: Diagnosis not present

## 2022-10-21 DIAGNOSIS — R634 Abnormal weight loss: Secondary | ICD-10-CM | POA: Diagnosis not present

## 2022-10-21 DIAGNOSIS — I7141 Pararenal abdominal aortic aneurysm, without rupture: Secondary | ICD-10-CM | POA: Diagnosis not present

## 2023-01-27 DIAGNOSIS — N184 Chronic kidney disease, stage 4 (severe): Secondary | ICD-10-CM | POA: Diagnosis not present

## 2023-01-27 DIAGNOSIS — E875 Hyperkalemia: Secondary | ICD-10-CM | POA: Diagnosis not present

## 2023-01-27 DIAGNOSIS — N2581 Secondary hyperparathyroidism of renal origin: Secondary | ICD-10-CM | POA: Diagnosis not present

## 2023-01-27 DIAGNOSIS — D631 Anemia in chronic kidney disease: Secondary | ICD-10-CM | POA: Diagnosis not present

## 2023-01-27 DIAGNOSIS — I129 Hypertensive chronic kidney disease with stage 1 through stage 4 chronic kidney disease, or unspecified chronic kidney disease: Secondary | ICD-10-CM | POA: Diagnosis not present

## 2023-04-28 DIAGNOSIS — E039 Hypothyroidism, unspecified: Secondary | ICD-10-CM | POA: Diagnosis not present

## 2023-04-28 DIAGNOSIS — R0602 Shortness of breath: Secondary | ICD-10-CM | POA: Diagnosis not present

## 2023-04-28 DIAGNOSIS — N184 Chronic kidney disease, stage 4 (severe): Secondary | ICD-10-CM | POA: Diagnosis not present

## 2023-04-28 DIAGNOSIS — I10 Essential (primary) hypertension: Secondary | ICD-10-CM | POA: Diagnosis not present

## 2023-04-28 DIAGNOSIS — D510 Vitamin B12 deficiency anemia due to intrinsic factor deficiency: Secondary | ICD-10-CM | POA: Diagnosis not present

## 2023-04-28 DIAGNOSIS — I4891 Unspecified atrial fibrillation: Secondary | ICD-10-CM | POA: Diagnosis not present

## 2023-04-28 DIAGNOSIS — E785 Hyperlipidemia, unspecified: Secondary | ICD-10-CM | POA: Diagnosis not present

## 2023-05-04 DIAGNOSIS — R0602 Shortness of breath: Secondary | ICD-10-CM | POA: Diagnosis not present

## 2023-05-04 DIAGNOSIS — I517 Cardiomegaly: Secondary | ICD-10-CM | POA: Diagnosis not present

## 2023-05-04 DIAGNOSIS — J849 Interstitial pulmonary disease, unspecified: Secondary | ICD-10-CM | POA: Diagnosis not present

## 2023-05-19 DIAGNOSIS — N2581 Secondary hyperparathyroidism of renal origin: Secondary | ICD-10-CM | POA: Diagnosis not present

## 2023-05-19 DIAGNOSIS — I129 Hypertensive chronic kidney disease with stage 1 through stage 4 chronic kidney disease, or unspecified chronic kidney disease: Secondary | ICD-10-CM | POA: Diagnosis not present

## 2023-05-19 DIAGNOSIS — E875 Hyperkalemia: Secondary | ICD-10-CM | POA: Diagnosis not present

## 2023-05-19 DIAGNOSIS — N184 Chronic kidney disease, stage 4 (severe): Secondary | ICD-10-CM | POA: Diagnosis not present

## 2023-05-19 DIAGNOSIS — D631 Anemia in chronic kidney disease: Secondary | ICD-10-CM | POA: Diagnosis not present

## 2023-09-12 DIAGNOSIS — I129 Hypertensive chronic kidney disease with stage 1 through stage 4 chronic kidney disease, or unspecified chronic kidney disease: Secondary | ICD-10-CM | POA: Diagnosis not present

## 2023-09-12 DIAGNOSIS — N184 Chronic kidney disease, stage 4 (severe): Secondary | ICD-10-CM | POA: Diagnosis not present

## 2023-09-12 DIAGNOSIS — D631 Anemia in chronic kidney disease: Secondary | ICD-10-CM | POA: Diagnosis not present

## 2023-09-12 DIAGNOSIS — N2581 Secondary hyperparathyroidism of renal origin: Secondary | ICD-10-CM | POA: Diagnosis not present

## 2023-09-12 DIAGNOSIS — E875 Hyperkalemia: Secondary | ICD-10-CM | POA: Diagnosis not present

## 2023-11-07 DIAGNOSIS — D510 Vitamin B12 deficiency anemia due to intrinsic factor deficiency: Secondary | ICD-10-CM | POA: Diagnosis not present

## 2023-11-07 DIAGNOSIS — N184 Chronic kidney disease, stage 4 (severe): Secondary | ICD-10-CM | POA: Diagnosis not present

## 2023-11-07 DIAGNOSIS — I10 Essential (primary) hypertension: Secondary | ICD-10-CM | POA: Diagnosis not present

## 2023-11-07 DIAGNOSIS — F419 Anxiety disorder, unspecified: Secondary | ICD-10-CM | POA: Diagnosis not present

## 2023-11-07 DIAGNOSIS — E785 Hyperlipidemia, unspecified: Secondary | ICD-10-CM | POA: Diagnosis not present

## 2023-11-07 DIAGNOSIS — I4891 Unspecified atrial fibrillation: Secondary | ICD-10-CM | POA: Diagnosis not present

## 2023-11-07 DIAGNOSIS — E039 Hypothyroidism, unspecified: Secondary | ICD-10-CM | POA: Diagnosis not present

## 2023-11-07 DIAGNOSIS — Z23 Encounter for immunization: Secondary | ICD-10-CM | POA: Diagnosis not present

## 2023-11-07 DIAGNOSIS — Z0000 Encounter for general adult medical examination without abnormal findings: Secondary | ICD-10-CM | POA: Diagnosis not present
